# Patient Record
Sex: Female | Born: 1950 | Race: White | Hispanic: No | Marital: Married | Smoking: Never smoker
Health system: Southern US, Community
[De-identification: ages and names within clinical notes are randomized; demographics above are authoritative.]

## PROBLEM LIST (undated history)

## (undated) HISTORY — PX: HYSTERECTOMY ABDOMINAL WITH SALPINGECTOMY: SHX6725

## (undated) HISTORY — PX: OTHER SURGICAL HISTORY: SHX169

---

## 2013-06-11 ENCOUNTER — Emergency Department (HOSPITAL_COMMUNITY)
Admission: EM | Admit: 2013-06-11 | Discharge: 2013-06-11 | Disposition: A | Discharge status: Home / Self Care | Payer: No Typology Code available for payment source | Attending: Emergency Medicine | Admitting: Emergency Medicine

## 2013-06-11 ENCOUNTER — Emergency Department (HOSPITAL_COMMUNITY): Payer: No Typology Code available for payment source

## 2013-06-11 ENCOUNTER — Encounter (HOSPITAL_COMMUNITY): Payer: Self-pay | Admitting: Emergency Medicine

## 2013-06-11 DIAGNOSIS — S6990XA Unspecified injury of unspecified wrist, hand and finger(s), initial encounter: Secondary | ICD-10-CM | POA: Insufficient documentation

## 2013-06-11 DIAGNOSIS — Y9389 Activity, other specified: Secondary | ICD-10-CM | POA: Insufficient documentation

## 2013-06-11 DIAGNOSIS — S0993XA Unspecified injury of face, initial encounter: Secondary | ICD-10-CM | POA: Insufficient documentation

## 2013-06-11 DIAGNOSIS — S8990XA Unspecified injury of unspecified lower leg, initial encounter: Secondary | ICD-10-CM | POA: Insufficient documentation

## 2013-06-11 DIAGNOSIS — Y92410 Unspecified street and highway as the place of occurrence of the external cause: Secondary | ICD-10-CM | POA: Insufficient documentation

## 2013-06-11 DIAGNOSIS — S0990XA Unspecified injury of head, initial encounter: Secondary | ICD-10-CM | POA: Insufficient documentation

## 2013-06-11 MED ORDER — IBUPROFEN 800 MG PO TABS: 800.0000 mg | ORAL_TABLET | Freq: Three times a day (TID) | ORAL | Status: DC

## 2013-06-11 MED ORDER — METHOCARBAMOL 500 MG PO TABS: 500.0000 mg | ORAL_TABLET | Freq: Two times a day (BID) | ORAL | Status: DC
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
## 2013-06-11 NOTE — ED Notes (Signed)
Pt complains of neck/head pain, R arm pain and L knee pain. Pt ambutory.

## 2013-06-11 NOTE — ED Provider Notes (Signed)
Medical screening examination/treatment/procedure(s) were performed by non-physician practitioner and as supervising physician I was immediately available for consultation/collaboration.  EKG Interpretation   None         Layla Maw Martel Galvan, DO 06/11/13 2256
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
## 2014-04-26 DIAGNOSIS — I83891 Varicose veins of right lower extremities with other complications: Secondary | ICD-10-CM

## 2014-04-26 DIAGNOSIS — Z90710 Acquired absence of both cervix and uterus: Secondary | ICD-10-CM | POA: Insufficient documentation

## 2014-04-26 HISTORY — DX: Varicose veins of right lower extremity with other complications: I83.891

## 2014-04-26 HISTORY — DX: Acquired absence of both cervix and uterus: Z90.710

## 2016-11-25 DIAGNOSIS — S42301A Unspecified fracture of shaft of humerus, right arm, initial encounter for closed fracture: Secondary | ICD-10-CM

## 2016-11-25 HISTORY — DX: Unspecified fracture of shaft of humerus, right arm, initial encounter for closed fracture: S42.301A

## 2017-06-13 ENCOUNTER — Telehealth: Payer: Self-pay

## 2017-06-13 NOTE — Telephone Encounter (Signed)
CRM received asking for you to give her a call at your convenience. Please call patient.

## 2017-08-01 NOTE — Telephone Encounter (Signed)
I just found this message.  Should have been routed to TongaVanessa, Olegario MessierKathy or TiraJason.  Can someone verify follow up?

## 2018-05-14 ENCOUNTER — Telehealth: Payer: Self-pay

## 2018-05-15 NOTE — Telephone Encounter (Signed)
error 

## 2018-07-27 ENCOUNTER — Encounter: Payer: Self-pay | Admitting: Emergency Medicine

## 2019-08-30 ENCOUNTER — Emergency Department (HOSPITAL_COMMUNITY)
Admission: EM | Admit: 2019-08-30 | Discharge: 2019-08-30 | Disposition: A | Discharge status: Home / Self Care | Payer: Medicare HMO | Attending: Emergency Medicine | Admitting: Emergency Medicine

## 2019-08-30 ENCOUNTER — Other Ambulatory Visit: Payer: Self-pay

## 2019-08-30 DIAGNOSIS — F419 Anxiety disorder, unspecified: Secondary | ICD-10-CM | POA: Diagnosis not present

## 2019-08-30 DIAGNOSIS — R4589 Other symptoms and signs involving emotional state: Secondary | ICD-10-CM | POA: Diagnosis present

## 2019-08-30 LAB — COMPREHENSIVE METABOLIC PANEL
ALT: 15 U/L (ref 0–44)
AST: 22 U/L (ref 15–41)
Albumin: 4.1 g/dL (ref 3.5–5.0)
Alkaline Phosphatase: 80 U/L (ref 38–126)
Anion gap: 10 (ref 5–15)
BUN: 10 mg/dL (ref 8–23)
CO2: 24 mmol/L (ref 22–32)
Calcium: 9.7 mg/dL (ref 8.9–10.3)
Chloride: 106 mmol/L (ref 98–111)
Creatinine, Ser: 0.79 mg/dL (ref 0.44–1.00)
GFR calc Af Amer: >60 mL/min (ref >60)
GFR calc non Af Amer: >60 mL/min (ref >60)
Glucose, Bld: 172 mg/dL — ABNORMAL HIGH (ref 70–99)
Potassium: 3.7 mmol/L (ref 3.5–5.1)
Sodium: 140 mmol/L (ref 135–145)
Total Bilirubin: 1.5 mg/dL — ABNORMAL HIGH (ref 0.3–1.2)
Total Protein: 7.2 g/dL (ref 6.5–8.1)

## 2019-08-30 LAB — CBC
HCT: 42 % (ref 36.0–46.0)
Hemoglobin: 13.2 g/dL (ref 12.0–15.0)
MCH: 27.7 pg (ref 26.0–34.0)
MCHC: 31.4 g/dL (ref 30.0–36.0)
MCV: 88.2 fL (ref 80.0–100.0)
Platelets: 205 10*3/uL (ref 150–400)
RBC: 4.76 MIL/uL (ref 3.87–5.11)
RDW: 13.7 % (ref 11.5–15.5)
WBC: 7.4 10*3/uL (ref 4.0–10.5)
nRBC: 0 % (ref 0.0–0.2)

## 2019-08-30 LAB — RAPID URINE DRUG SCREEN, HOSP PERFORMED
Amphetamines: NOT DETECTED
Barbiturates: NOT DETECTED
Benzodiazepines: NOT DETECTED
Cocaine: NOT DETECTED
Opiates: NOT DETECTED
Tetrahydrocannabinol: NOT DETECTED

## 2019-08-30 LAB — SALICYLATE LEVEL: Salicylate Lvl: <7 mg/dL — ABNORMAL LOW (ref 7.0–30.0)

## 2019-08-30 LAB — ACETAMINOPHEN LEVEL: Acetaminophen (Tylenol), Serum: <10 ug/mL — ABNORMAL LOW (ref 10–30)

## 2019-08-30 LAB — ETHANOL: Alcohol, Ethyl (B): <10 mg/dL (ref <10)
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
## 2019-08-30 NOTE — ED Notes (Signed)
Patient denies pain and is resting comfortably.  
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
## 2019-08-30 NOTE — Discharge Instructions (Signed)
Please schedule a primary care appointment.  If at any point you feel unsafe or are concerned please seek additional medical care and evaluation. I recommend that you consider seeing a counselor.  I have given you a list of some of the counselors in the area however you may choose your own.  Your blood pressure was slightly high while in the emergency room.  Please see your primary care doctor in the next 1-2 weeks to get this re-checked.   I would recommend limiting the amount of media and news that you are consuming.  Watching excessive amounts of the news, or reading large amounts of news can increase anxiety.    It is important that you are getting a good night sleep, eating healthy, and getting regular exercise.  Today I recommended a CT scan on your head which you refused.

## 2019-08-30 NOTE — ED Notes (Signed)
Pt verbalized understanding of /dc instructions, follow up care and s/s requiring return to ed. Pt has no additional questions

## 2019-09-04 DIAGNOSIS — F22 Delusional disorders: Secondary | ICD-10-CM | POA: Insufficient documentation

## 2019-09-04 HISTORY — DX: Delusional disorders: F22

## 2019-09-10 DIAGNOSIS — R9431 Abnormal electrocardiogram [ECG] [EKG]: Secondary | ICD-10-CM

## 2019-09-10 HISTORY — DX: Abnormal electrocardiogram (ECG) (EKG): R94.31

## 2019-09-14 MED ORDER — RISPERIDONE 0.5 MG PO TABS
0.50 | ORAL_TABLET | ORAL | Status: DC
Start: ? — End: 2019-09-14

## 2019-09-14 MED ORDER — ALUM & MAG HYDROXIDE-SIMETH 200-200-20 MG/5ML PO SUSP
15.00 | ORAL | Status: DC
Start: ? — End: 2019-09-14

## 2019-09-14 MED ORDER — THERA PO TABS
1.00 | ORAL_TABLET | ORAL | Status: DC
Start: 2019-09-15 — End: 2019-09-14

## 2019-09-14 MED ORDER — ASPIRIN 81 MG PO TBEC
81.00 | DELAYED_RELEASE_TABLET | ORAL | Status: DC
Start: 2019-09-15 — End: 2019-09-14

## 2019-09-14 MED ORDER — GENERIC EXTERNAL MEDICATION
Status: DC
Start: ? — End: 2019-09-14

## 2019-09-14 MED ORDER — ACETAMINOPHEN 325 MG PO TABS
650.00 | ORAL_TABLET | ORAL | Status: DC
Start: ? — End: 2019-09-14

## 2019-09-14 MED ORDER — POLYETHYLENE GLYCOL 3350 17 GM/SCOOP PO POWD
17.00 | ORAL | Status: DC
Start: ? — End: 2019-09-14

## 2019-09-14 MED ORDER — RISPERIDONE 3 MG PO TABS
3.00 | ORAL_TABLET | ORAL | Status: DC
Start: 2019-09-14 — End: 2019-09-14

## 2019-09-20 ENCOUNTER — Ambulatory Visit (HOSPITAL_COMMUNITY): Payer: Medicare HMO | Admitting: Psychiatry

## 2019-09-23 ENCOUNTER — Ambulatory Visit (INDEPENDENT_AMBULATORY_CARE_PROVIDER_SITE_OTHER): Payer: Medicare HMO | Admitting: Psychiatry

## 2019-09-23 ENCOUNTER — Encounter (HOSPITAL_COMMUNITY): Payer: Self-pay | Admitting: Psychiatry

## 2019-09-23 ENCOUNTER — Other Ambulatory Visit: Payer: Self-pay

## 2019-09-23 DIAGNOSIS — F419 Anxiety disorder, unspecified: Secondary | ICD-10-CM

## 2019-09-23 DIAGNOSIS — F22 Delusional disorders: Secondary | ICD-10-CM
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
## 2019-11-15 DIAGNOSIS — G4733 Obstructive sleep apnea (adult) (pediatric): Secondary | ICD-10-CM

## 2019-11-15 DIAGNOSIS — Z9989 Dependence on other enabling machines and devices: Secondary | ICD-10-CM

## 2019-11-15 HISTORY — DX: Dependence on other enabling machines and devices: Z99.89

## 2019-11-15 HISTORY — DX: Obstructive sleep apnea (adult) (pediatric): G47.33

## 2020-05-24 ENCOUNTER — Ambulatory Visit (INDEPENDENT_AMBULATORY_CARE_PROVIDER_SITE_OTHER): Payer: Medicare HMO | Admitting: Family Medicine

## 2020-05-24 ENCOUNTER — Encounter: Payer: Self-pay | Admitting: Family Medicine

## 2020-05-24 DIAGNOSIS — F22 Delusional disorders: Secondary | ICD-10-CM | POA: Diagnosis not present

## 2020-05-24 DIAGNOSIS — G4733 Obstructive sleep apnea (adult) (pediatric): Secondary | ICD-10-CM

## 2020-05-24 DIAGNOSIS — Z9989 Dependence on other enabling machines and devices: Secondary | ICD-10-CM | POA: Diagnosis not present

## 2020-05-24 NOTE — Assessment & Plan Note (Signed)
Denies symptoms at this time.  Does not endorse any delusional or paranoid ideations.  Encouraged to consider f/u with psychiatry.

## 2020-05-24 NOTE — Assessment & Plan Note (Signed)
Managed by neurology, stable at this time.

## 2020-05-24 NOTE — Progress Notes (Signed)
Katelyn Ross - 70 y.o. female MRN [id number]  Date of birth: 1951/07/16  Subjective Chief Complaint  Patient presents with  . Establish Care    HPI Katelyn Ross is a 69 y.o. female here today for initial visit.  She reports that she has been in pretty good health.  She does have history of OSA, seen by neuro for this.  She is not taking any long term medications at this time.  She really only comes in annually for medicare wellness exam.    Per previous records she was diagnosed with delusional disorder resulting in psychiatric admission.  She was started on risperidone and had f/u with Dr. Gilmore Laroche post hospitalization but never followed up afterwards and has discontinued risperdal.  She denies any symptoms at this time.   ROS:  A comprehensive ROS was completed and negative except as noted per HPI     Allergies  Allergen Reactions  . Penicillins Other (See Comments)    Heart felt funny      History reviewed. No pertinent past medical history.  Past Surgical History:  Procedure Laterality Date  . broken bone repai    . HYSTERECTOMY ABDOMINAL WITH SALPINGECTOMY      Social History   Socioeconomic History  . Marital status: Married    Spouse name: Not on file  . Number of children: Not on file  . Years of education: Not on file  . Highest education level: Not on file  Occupational History  . Not on file  Tobacco Use  . Smoking status: Never Smoker  . Smokeless tobacco: Never Used  Vaping Use  . Vaping Use: Never used  Substance and Sexual Activity  . Alcohol use: Not Currently  . Drug use: No  . Sexual activity: Yes    Partners: Male  Other Topics Concern  . Not on file  Social History Narrative  . Not on file   Social Determinants of Health   Financial Resource Strain:   . Difficulty of Paying Living Expenses: Not on file  Food Insecurity:   . Worried About Programme researcher, broadcasting/film/video in the Last Year: Not on file  . Ran Out of Food in the Last Year: Not on  file  Transportation Needs:   . Lack of Transportation (Medical): Not on file  . Lack of Transportation (Non-Medical): Not on file  Physical Activity:   . Days of Exercise per Week: Not on file  . Minutes of Exercise per Session: Not on file  Stress:   . Feeling of Stress : Not on file  Social Connections:   . Frequency of Communication with Friends and Family: Not on file  . Frequency of Social Gatherings with Friends and Family: Not on file  . Attends Religious Services: Not on file  . Active Member of Clubs or Organizations: Not on file  . Attends Banker Meetings: Not on file  . Marital Status: Not on file    History reviewed. No pertinent family history.  Health Maintenance  Topic Date Due  . Hepatitis C Screening  Never done  . COVID-19 Vaccine (1) 06/09/2020 (Originally 12/12/1962)  . INFLUENZA VACCINE  11/09/2020 (Originally 03/12/2020)  . MAMMOGRAM  05/24/2021 (Originally 12/11/2000)  . DEXA SCAN  05/24/2021 (Originally 12/12/2015)  . COLONOSCOPY  05/24/2021 (Originally 12/11/2000)  . TETANUS/TDAP  05/24/2021 (Originally 12/11/1969)  . PNA vac Low Risk Adult (1 of 2 - PCV13) 05/24/2021 (Originally 12/12/2015)     ----------------------------------------------------------------------------------------------------------------------------------------------------------------------------------------------------------------- Physical Exam BP 132/82 (BP Location:  Left Arm, Patient Position: Sitting, Cuff Size: Normal)   Pulse (!) 118   Temp 99.6 F (37.6 C) (Oral)   Ht 5\' 7"  (1.702 m)   Wt 137 lb 1.9 oz (62.2 kg)   BMI 21.48 kg/m   Physical Exam Constitutional:      Appearance: Normal appearance.  HENT:     Head: Normocephalic and atraumatic.  Eyes:     General: No scleral icterus. Cardiovascular:     Rate and Rhythm: Normal rate and regular rhythm.  Pulmonary:     Effort: Pulmonary effort is normal.     Breath sounds: Normal breath sounds.  Musculoskeletal:      Cervical back: Neck supple.  Neurological:     General: No focal deficit present.     Mental Status: She is alert.  Psychiatric:        Mood and Affect: Mood normal.        Behavior: Behavior normal.     ------------------------------------------------------------------------------------------------------------------------------------------------------------------------------------------------------------------- Assessment and Plan  Delusional disorder (HCC) Denies symptoms at this time.  Does not endorse any delusional or paranoid ideations.  Encouraged to consider f/u with psychiatry.    OSA on CPAP Managed by neurology, stable at this time.    No orders of the defined types were placed in this encounter.   Return in about 6 months (around 11/22/2020) for annual wellness.    This visit occurred during the SARS-CoV-2 public health emergency.  Safety protocols were in place, including screening questions prior to the visit, additional usage of staff PPE, and extensive cleaning of exam room while observing appropriate contact time as indicated for disinfecting solutions.

## 2020-05-24 NOTE — Patient Instructions (Signed)
Nice to see you today! I would encourage you to get COVID vaccine.  Let's plan to follow up in April of next year to annual wellness visit.

## 2020-06-07 ENCOUNTER — Telehealth: Payer: Self-pay

## 2020-06-07 NOTE — Telephone Encounter (Signed)
Pt left voicemail with just her name and phone number.   Attempted to contact her @ 301pm on 06/07/20. No answer.

## 2020-11-22 ENCOUNTER — Ambulatory Visit: Payer: Medicare HMO | Admitting: Family Medicine

## 2020-12-01 ENCOUNTER — Encounter: Payer: Self-pay | Admitting: Family Medicine

## 2020-12-01 ENCOUNTER — Other Ambulatory Visit: Payer: Self-pay

## 2020-12-01 ENCOUNTER — Ambulatory Visit (INDEPENDENT_AMBULATORY_CARE_PROVIDER_SITE_OTHER): Payer: Medicare HMO | Admitting: Family Medicine

## 2020-12-01 VITALS — BP 137/77 | HR 99 | Temp 97.5°F | Ht 64.0 in | Wt 135.0 lb

## 2020-12-01 DIAGNOSIS — Z0000 Encounter for general adult medical examination without abnormal findings: Secondary | ICD-10-CM | POA: Diagnosis not present

## 2020-12-01 DIAGNOSIS — M2042 Other hammer toe(s) (acquired), left foot: Secondary | ICD-10-CM | POA: Diagnosis not present

## 2020-12-01 DIAGNOSIS — Z13220 Encounter for screening for lipoid disorders: Secondary | ICD-10-CM

## 2020-12-01 DIAGNOSIS — M2041 Other hammer toe(s) (acquired), right foot: Secondary | ICD-10-CM

## 2020-12-01 NOTE — Patient Instructions (Signed)
  Katelyn Ross , Thank you for taking time to come for your Medicare Wellness Visit. I appreciate your ongoing commitment to your health goals. Please review the following plan we discussed and let me know if I can assist you in the future.   These are the goals we discussed: Goals   None     This is a list of the screening recommended for you and due dates:  Health Maintenance  Topic Date Due  .  Hepatitis C: One time screening is recommended by Center for Disease Control  (CDC) for  adults born from [birth year] through 1965.   Never done  . Mammogram  05/24/2021*  . DEXA scan (bone density measurement)  05/24/2021*  . Colon Cancer Screening  05/24/2021*  . Tetanus Vaccine  05/24/2021*  . Pneumonia vaccines (1 of 2 - PCV13) 05/24/2021*  . COVID-19 Vaccine (1) 12/17/2021*  . Flu Shot  03/12/2021  . HPV Vaccine  Aged Out  *Topic was postponed. The date shown is not the original due date.

## 2020-12-01 NOTE — Progress Notes (Signed)
Medicare Annual Wellness Visit  12/01/2020  Subjective:  Katelyn Ross is a 70 y.o. female primary care patient of Katelyn Coombe, DO who had a Medicare Annual Wellness Visit today via telephone. Katelyn Ross is Retired and lives with their spouse.  Katelyn Ross reports that Katelyn Ross is socially active and does interact with friends/family regularly. Katelyn Ross is moderately physically active and enjoys gardening, reading, shopping.  Patient Care Team: Katelyn Coombe, DO as PCP - General (Family Medicine)  Advanced Directives 08/30/2019  Does Patient Have a Medical Advance Directive? No  Would patient like information on creating a medical advance directive? No - Patient declined    Hospital Utilization Over the Past 12 Months: # of hospitalizations or ER visits: 0 # of surgeries: 0  Review of Systems    Patient reports that her overall health is unchanged compared to last year.  Negative except a  All other systems negative.  Pain Assessment       Current Medications & Allergies (verified) Allergies as of 12/01/2020      Reactions   Penicillins Other (See Comments)   Heart felt funny      Medication List    as of December 01, 2020 10:30 AM   You have not been prescribed any medications.     History (reviewed): No past medical history on file. Past Surgical History:  Procedure Laterality Date  . broken bone repai    . HYSTERECTOMY ABDOMINAL WITH SALPINGECTOMY     No family history on file. Social History   Socioeconomic History  . Marital status: Married    Spouse name: Not on file  . Number of children: Not on file  . Years of education: Not on file  . Highest education level: Not on file  Occupational History  . Not on file  Tobacco Use  . Smoking status: Never Smoker  . Smokeless tobacco: Never Used  Vaping Use  . Vaping Use: Never used  Substance and Sexual Activity  . Alcohol use: Not Currently  . Drug use: No  . Sexual activity: Yes    Partners: Male  Other Topics  Concern  . Not on file  Social History Narrative  . Not on file   Social Determinants of Health   Financial Resource Strain: Not on file  Food Insecurity: Not on file  Transportation Needs: Not on file  Physical Activity: Not on file  Stress: Not on file  Social Connections: Not on file    Activities of Daily Living No flowsheet data found.  Patient Education/Literacy:    Depression Screen PHQ 2/9 Scores 05/24/2020  PHQ - 2 Score 0  PHQ- 9 Score 0     Fall Risk Fall Risk  12/03/2020 12/01/2020  Falls in the past year? 0 0  Number falls in past yr: 0 0  Injury with Fall? 0 0  Risk for fall due to : - History of fall(s)  Follow up Falls evaluation completed;Falls prevention discussed Falls evaluation completed      Exercise   Walking frequently  Diet Patient reports consuming 3 meals a day and 1 snack(s) a day Patient reports that her primary diet is: Regular Patient reports that Katelyn Ross does have regular access to food.   Depression Screen PHQ 2/9 Scores 05/24/2020  PHQ - 2 Score 0  PHQ- 9 Score 0     Fall Risk No flowsheet data found.   Objective:    Katelyn Ross was alert and able to participate appropriately during our visit.  Today's Vitals   12/01/20 1028  BP: 137/77  Pulse: 99  Temp: (!) 97.5 F (36.4 C)  SpO2: 99%  Weight: 135 lb (61.2 kg)  Height: 5\' 4"  (1.626 m)   Body mass index is 23.17 kg/m.  Advanced Directives 08/30/2019  Does Patient Have a Medical Advance Directive? No  Would patient like information on creating a medical advance directive? No - Patient declined    Hearing/Vision  . Katelyn Ross did not seem to have difficulty with hearing/understanding during the face to face visit . Reports that Katelyn Ross has had a formal eye exam by an eye care professional within the past year . Reports that Katelyn Ross has not had a formal hearing evaluation within the past year  Cognitive Function:  Mini-Cog - 12/03/20 2235    Normal clock drawing test? yes     How many words correct? 2           Normal Cognitive Function Screening: Yes  No flowsheet data found.   Immunization & Health Maintenance Record  There is no immunization history on file for this patient.  Health Maintenance  Topic Date Due  . Hepatitis C Screening  Never done  . COVID-19 Vaccine (1) Never done  . MAMMOGRAM  05/24/2021 (Originally 12/11/2000)  . DEXA SCAN  05/24/2021 (Originally 12/12/2015)  . COLONOSCOPY (Pts 45-52yrs Insurance coverage will need to be confirmed)  05/24/2021 (Originally 12/12/1995)  . TETANUS/TDAP  05/24/2021 (Originally 12/11/1969)  . PNA vac Low Risk Adult (1 of 2 - PCV13) 05/24/2021 (Originally 12/12/2015)  . INFLUENZA VACCINE  03/12/2021  . HPV VACCINES  Aged Out       Assessment:  This is a routine wellness examination for Katelyn Ross 05/12/2021.  Health Maintenance: Due or Overdue Health Maintenance Due  Topic Date Due  . Hepatitis C Screening  Never done  . COVID-19 Vaccine (1) Never done    Katelyn Ross does not need a referral for Community Assistance: Care Management:   no Social Work:    no Prescription Assistance:  no Nutrition/Diabetes Education:  no   Plan:  Personalized Goals Goals Addressed   None    Personalized Health Maintenance & Screening Recommendations  Pneumococcal vaccine  Screening mammography Bone densitometry screening Colorectal cancer screening  Lung Cancer Screening Recommended: no (Low Dose CT Chest recommended if Age 66-80 years, 30 pack-year currently smoking OR have quit w/in past 15 years) Hepatitis C Screening recommended: yes HIV Screening recommended: no  Advanced Directives: Written information was not prepared per patient's request.  Referrals & Orders No orders of the defined types were placed in this encounter.   Follow-up Plan . Follow-up with [place], DO as planned . Katelyn Ross declines all recommended screenings and vaccinations.  Katelyn Ross is aware of risks associated with not  receiving recommended vaccines and screenings.   . Follow up in 6 months with PCP   I have personally reviewed and noted the following in the patient's chart:   . Medical and social history . Use of alcohol, tobacco or illicit drugs  . Current medications and supplements . Functional ability and status . Nutritional status . Physical activity . Advanced directives . List of other physicians . Hospitalizations, surgeries, and ER visits in previous 12 months . Vitals . Screenings to include cognitive, depression, and falls . Referrals and appointments  In addition, I have reviewed and discussed with Katelyn Ross Katelyn Ross certain preventive protocols, quality metrics, and best practice recommendations. A written personalized care plan for preventive services as well as  general preventive health recommendations is available and can be mailed to the patient at her request.      Katelyn Ross  12/01/2020

## 2020-12-03 DIAGNOSIS — Z0000 Encounter for general adult medical examination without abnormal findings: Secondary | ICD-10-CM | POA: Insufficient documentation

## 2020-12-03 HISTORY — DX: Encounter for general adult medical examination without abnormal findings: Z00.00

## 2020-12-03 NOTE — Progress Notes (Signed)
Katelyn Ross - 70 y.o. female MRN [id number]  Date of birth: 03/04/1951  Subjective Chief Complaint  Patient presents with  . Medicare Wellness    HPI Katelyn Ross is a 70 y.o. female here today for annual exam.  She is doing well without complaints today. She is due for several health  Maintenance items including immunizations and screenings.  She is having some issues with hammer toes and would like to see podiatry.  She denies significant pain.    She does walk for exercise.  She feels like her diet is healthy.   ROS:  A comprehensive ROS was completed and negative except as noted per HPI  Allergies  Allergen Reactions  . Penicillins Other (See Comments)    Heart felt funny      History reviewed. No pertinent past medical history.  Past Surgical History:  Procedure Laterality Date  . broken bone repai    . HYSTERECTOMY ABDOMINAL WITH SALPINGECTOMY      Social History   Socioeconomic History  . Marital status: Married    Spouse name: Not on file  . Number of children: Not on file  . Years of education: Not on file  . Highest education level: Not on file  Occupational History  . Not on file  Tobacco Use  . Smoking status: Never Smoker  . Smokeless tobacco: Never Used  Vaping Use  . Vaping Use: Never used  Substance and Sexual Activity  . Alcohol use: Not Currently  . Drug use: No  . Sexual activity: Yes    Partners: Male  Other Topics Concern  . Not on file  Social History Narrative  . Not on file   Social Determinants of Health   Financial Resource Strain: Not on file  Food Insecurity: Not on file  Transportation Needs: Not on file  Physical Activity: Not on file  Stress: Not on file  Social Connections: Not on file    History reviewed. No pertinent family history.  Health Maintenance  Topic Date Due  . Hepatitis C Screening  Never done  . MAMMOGRAM  05/24/2021 (Originally 12/11/2000)  . DEXA SCAN  05/24/2021 (Originally 12/12/2015)  .  COLONOSCOPY (Pts 45-1yrs Insurance coverage will need to be confirmed)  05/24/2021 (Originally 12/12/1995)  . TETANUS/TDAP  05/24/2021 (Originally 12/11/1969)  . PNA vac Low Risk Adult (1 of 2 - PCV13) 05/24/2021 (Originally 12/12/2015)  . COVID-19 Vaccine (1) 12/17/2021 (Originally 12/12/1955)  . INFLUENZA VACCINE  03/12/2021  . HPV VACCINES  Aged Out     ----------------------------------------------------------------------------------------------------------------------------------------------------------------------------------------------------------------- Physical Exam BP 137/77 (BP Location: Left Arm, Patient Position: Sitting, Cuff Size: Normal)   Pulse 99   Temp (!) 97.5 F (36.4 C)   Ht 5\' 4"  (1.626 m)   Wt 135 lb (61.2 kg)   SpO2 99%   BMI 23.17 kg/m   Physical Exam Constitutional:      General: She is not in acute distress. HENT:     Head: Normocephalic and atraumatic.     Nose: Nose normal.  Eyes:     General: No scleral icterus.    Conjunctiva/sclera: Conjunctivae normal.  Neck:     Thyroid: No thyromegaly.  Cardiovascular:     Rate and Rhythm: Normal rate and regular rhythm.     Heart sounds: Normal heart sounds.  Pulmonary:     Effort: Pulmonary effort is normal.     Breath sounds: Normal breath sounds.  Abdominal:     General: Bowel sounds are normal. There is no distension.  Palpations: Abdomen is soft.     Tenderness: There is no abdominal tenderness. There is no guarding.  Musculoskeletal:        General: Normal range of motion.     Cervical back: Normal range of motion and neck supple.  Lymphadenopathy:     Cervical: No cervical adenopathy.  Skin:    General: Skin is warm and dry.     Findings: No rash.  Neurological:     Mental Status: She is alert and oriented to person, place, and time.     Cranial Nerves: No cranial nerve deficit.     Coordination: Coordination normal.  Psychiatric:        Behavior: Behavior normal.      ------------------------------------------------------------------------------------------------------------------------------------------------------------------------------------------------------------------- Assessment and Plan  No problem-specific Assessment & Plan notes found for this encounter.   No orders of the defined types were placed in this encounter.   No follow-ups on file.    This visit occurred during the SARS-CoV-2 public health emergency.  Safety protocols were in place, including screening questions prior to the visit, additional usage of staff PPE, and extensive cleaning of exam room while observing appropriate contact time as indicated for disinfecting solutions.

## 2020-12-03 NOTE — Assessment & Plan Note (Signed)
Well adult Orders Placed This Encounter  Procedures  . COMPLETE METABOLIC PANEL WITH GFR  . CBC with Differential  . Lipid Panel w/reflex Direct LDL  . Ambulatory referral to Podiatry    Referral Priority:   Routine    Referral Type:   Consultation    Referral Reason:   Specialty Services Required    Requested Specialty:   Podiatry    Number of Visits Requested:   1  Immunizations:  Discussed pneumovax with her, declines at this time.  Screening:  Declines mammogram, Hep C, DEXA and colon cancer screening.  She is aware of risks of not having these done.  Anticipatory guidance/Risk factor reduction:  Recommendations per AVS.

## 2020-12-05 ENCOUNTER — Telehealth: Payer: Self-pay

## 2020-12-05 NOTE — Telephone Encounter (Signed)
Pt lvm asking questions concerning labs that had been ordered by Dr. Ashley Royalty.   Advised patient she could have the labs drawn at [facility] without an appointment.   Pt states it's hard for her to have fasting labs. She eats 1st thing in the morning to avoid low blood sugars.   Advised Katelyn Ross that she could fast for 6 hours at any point during the day for the labs or she could have the labs drawn non-fasting (just advise the lab).

## 2020-12-07 LAB — LIPID PANEL W/REFLEX DIRECT LDL
Cholesterol: 200 mg/dL — ABNORMAL HIGH (ref <200)
HDL: 53 mg/dL (ref >=50)
LDL Cholesterol (Calc): 127 mg/dL (calc) — ABNORMAL HIGH
Non-HDL Cholesterol (Calc): 147 mg/dL (calc) — ABNORMAL HIGH (ref <130)
Total CHOL/HDL Ratio: 3.8 (calc) (ref <5.0)
Triglycerides: 102 mg/dL (ref <150)

## 2020-12-07 LAB — CBC WITH DIFFERENTIAL/PLATELET
Absolute Monocytes: 389 cells/uL (ref 200–950)
Basophils Absolute: 38 cells/uL (ref 0–200)
Basophils Relative: 0.7 %
Eosinophils Absolute: 70 cells/uL (ref 15–500)
Eosinophils Relative: 1.3 %
HCT: 36.6 % (ref 35.0–45.0)
Hemoglobin: 11.6 g/dL — ABNORMAL LOW (ref 11.7–15.5)
Lymphs Abs: 2009 cells/uL (ref 850–3900)
MCH: 27.1 pg (ref 27.0–33.0)
MCHC: 31.7 g/dL — ABNORMAL LOW (ref 32.0–36.0)
MCV: 85.5 fL (ref 80.0–100.0)
MPV: 10.4 fL (ref 7.5–12.5)
Monocytes Relative: 7.2 %
Neutro Abs: 2894 cells/uL (ref 1500–7800)
Neutrophils Relative %: 53.6 %
Platelets: 197 10*3/uL (ref 140–400)
RBC: 4.28 10*6/uL (ref 3.80–5.10)
RDW: 13.5 % (ref 11.0–15.0)
Total Lymphocyte: 37.2 %
WBC: 5.4 10*3/uL (ref 3.8–10.8)

## 2020-12-07 LAB — COMPLETE METABOLIC PANEL WITH GFR
AG Ratio: 1.8 (calc) (ref 1.0–2.5)
ALT: 12 U/L (ref 6–29)
AST: 20 U/L (ref 10–35)
Albumin: 4.4 g/dL (ref 3.6–5.1)
Alkaline phosphatase (APISO): 90 U/L (ref 37–153)
BUN: 16 mg/dL (ref 7–25)
CO2: 25 mmol/L (ref 20–32)
Calcium: 9.3 mg/dL (ref 8.6–10.4)
Chloride: 105 mmol/L (ref 98–110)
Creat: 0.65 mg/dL (ref 0.50–0.99)
GFR, Est African American: 105 mL/min/{1.73_m2} (ref >=60)
GFR, Est Non African American: 91 mL/min/{1.73_m2} (ref >=60)
Globulin: 2.5 g/dL (calc) (ref 1.9–3.7)
Glucose, Bld: 94 mg/dL (ref 65–99)
Potassium: 4.2 mmol/L (ref 3.5–5.3)
Sodium: 141 mmol/L (ref 135–146)
Total Bilirubin: 1.1 mg/dL (ref 0.2–1.2)
Total Protein: 6.9 g/dL (ref 6.1–8.1)

## 2020-12-14 ENCOUNTER — Ambulatory Visit: Payer: Medicare HMO | Admitting: Podiatry

## 2020-12-14 ENCOUNTER — Other Ambulatory Visit: Payer: Self-pay

## 2020-12-14 ENCOUNTER — Ambulatory Visit (INDEPENDENT_AMBULATORY_CARE_PROVIDER_SITE_OTHER): Payer: Medicare HMO

## 2020-12-14 ENCOUNTER — Other Ambulatory Visit: Payer: Self-pay | Admitting: Family Medicine

## 2020-12-14 DIAGNOSIS — M2042 Other hammer toe(s) (acquired), left foot: Secondary | ICD-10-CM

## 2020-12-14 DIAGNOSIS — M2041 Other hammer toe(s) (acquired), right foot: Secondary | ICD-10-CM | POA: Diagnosis not present

## 2020-12-14 DIAGNOSIS — D649 Anemia, unspecified: Secondary | ICD-10-CM

## 2020-12-14 NOTE — Patient Instructions (Signed)
Look at getting an extra depth or double depth shoe 

## 2020-12-19 NOTE — Progress Notes (Signed)
Subjective:   Patient ID: Katelyn Ross, female   DOB: 70 y.o.   MRN: [id number]   HPI 70 year old female presents the office today for concerns of bilateral hammertoes and she was discussed various treatment options for this.  She is not sure about surgery.  Currently not having any pain as she has changed shoes which does cause the pain to her resolved.  Also states that she previously saw a chiropractor was told she had neuropathy.  She states that she go months at a time without any discomfort but occasional get some burning pain to the toes.  She said no recent treatment otherwise.  She has no other concerns today.   Review of Systems  All other systems reviewed and are negative.  No past medical history on file.  Past Surgical History:  Procedure Laterality Date  . broken bone repai    . HYSTERECTOMY ABDOMINAL WITH SALPINGECTOMY      No current outpatient medications on file.  Allergies  Allergen Reactions  . Penicillins Other (See Comments)    Heart felt funny            Objective:  Physical Exam  General: AAO x3, NAD  Dermatological: Skin is warm, dry and supple bilateral. There are no open sores, no preulcerative lesions, no rash or signs of infection present.  Vascular: Dorsalis Pedis artery and Posterior Tibial artery pedal pulses are 2/4 bilateral with immedate capillary fill time.  There is no pain with calf compression, swelling, warmth, erythema.   Neruologic: Grossly intact via light touch bilateral.  Sensation intact with Semmes Weinstein monofilament  Musculoskeletal: Bunion deformities present with hammertoes.  On the right second toe the second toes overlapping the hallux.  Not able to elicit any area of tenderness.  No edema, erythema.  Muscular strength 5/5 in all groups tested bilateral.  Gait: Unassisted, Nonantalgic.       Assessment:   70 year old female with bunion, hammertoe deformity     Plan:  -Treatment options discussed including  all alternatives, risks, and complications -Etiology of symptoms were discussed -X-rays were obtained and reviewed with the patient.  No evidence of acute fracture or stress fracture.  Bunion, hammertoes are present. -We discussed both conservative as well as surgical treatment options.  Currently not having any pain so were also can hold off on surgery.  Discussed various conservative treatments including shoe modifications, offloading pads which were also dispensed today.  I do not think that she has neuropathy as her symptoms are so intermittent but we will monitor this.  Vivi Barrack DPM

## 2021-01-10 ENCOUNTER — Ambulatory Visit (INDEPENDENT_AMBULATORY_CARE_PROVIDER_SITE_OTHER): Payer: Medicare HMO | Admitting: Family Medicine

## 2021-01-10 ENCOUNTER — Other Ambulatory Visit: Payer: Self-pay

## 2021-01-10 ENCOUNTER — Encounter: Payer: Self-pay | Admitting: Family Medicine

## 2021-01-10 VITALS — BP 139/78 | HR 86 | Ht 64.0 in | Wt 131.0 lb

## 2021-01-10 DIAGNOSIS — E559 Vitamin D deficiency, unspecified: Secondary | ICD-10-CM

## 2021-01-10 DIAGNOSIS — M899 Disorder of bone, unspecified: Secondary | ICD-10-CM | POA: Diagnosis not present

## 2021-01-10 DIAGNOSIS — R5383 Other fatigue: Secondary | ICD-10-CM

## 2021-01-10 DIAGNOSIS — D649 Anemia, unspecified: Secondary | ICD-10-CM | POA: Diagnosis not present

## 2021-01-10 HISTORY — DX: Other fatigue: R53.83

## 2021-01-10 LAB — CBC WITH DIFFERENTIAL/PLATELET
HCT: 34.4 % — ABNORMAL LOW (ref 35.0–45.0)
MCH: 27.5 pg (ref 27.0–33.0)
Neutro Abs: 3332 cells/uL (ref 1500–7800)

## 2021-01-10 NOTE — Patient Instructions (Signed)
We'll be in touch with lab results.  Try adding Geritol liquid. This has iron and B vitamins.

## 2021-01-10 NOTE — Progress Notes (Signed)
Katelyn Ross - 70 y.o. female MRN [id number]  Date of birth: 07-29-1951  Subjective Chief Complaint  Patient presents with  . Fatigue    HPI Katelyn Ross is a 70 y.o. female here today with complaint of fatigue.  Fatigue is chronic and she wanted to check this out prior to having her updated COVID vaccine.  Her appetite has been good.  She denies weight loss, fever, chills.  Recent labs did show some mild anemia.  Follow up iron panel ordered but she hasn't had this completed yet.    ROS:  A comprehensive ROS was completed and negative except as noted per HPI  Allergies  Allergen Reactions  . Penicillins Other (See Comments)    Heart felt funny      History reviewed. No pertinent past medical history.  Past Surgical History:  Procedure Laterality Date  . broken bone repai    . HYSTERECTOMY ABDOMINAL WITH SALPINGECTOMY      Social History   Socioeconomic History  . Marital status: Married    Spouse name: Not on file  . Number of children: Not on file  . Years of education: Not on file  . Highest education level: Not on file  Occupational History  . Not on file  Tobacco Use  . Smoking status: Never Smoker  . Smokeless tobacco: Never Used  Vaping Use  . Vaping Use: Never used  Substance and Sexual Activity  . Alcohol use: Not Currently  . Drug use: No  . Sexual activity: Yes    Partners: Male  Other Topics Concern  . Not on file  Social History Narrative  . Not on file   Social Determinants of Health   Financial Resource Strain: Not on file  Food Insecurity: Not on file  Transportation Needs: Not on file  Physical Activity: Not on file  Stress: Not on file  Social Connections: Not on file    History reviewed. No pertinent family history.  Health Maintenance  Topic Date Due  . Hepatitis C Screening  Never done  . Zoster Vaccines- Shingrix (1 of 2) Never done  . MAMMOGRAM  05/24/2021 (Originally 12/11/2000)  . DEXA SCAN  05/24/2021 (Originally  12/12/2015)  . COLONOSCOPY (Pts 45-68yrs Insurance coverage will need to be confirmed)  05/24/2021 (Originally 12/12/1995)  . TETANUS/TDAP  05/24/2021 (Originally 12/11/1969)  . PNA vac Low Risk Adult (1 of 2 - PCV13) 05/24/2021 (Originally 12/12/2015)  . COVID-19 Vaccine (1) 12/17/2021 (Originally 12/12/1955)  . INFLUENZA VACCINE  03/12/2021  . HPV VACCINES  Aged Out     ----------------------------------------------------------------------------------------------------------------------------------------------------------------------------------------------------------------- Physical Exam BP 139/78 (BP Location: Left Arm, Patient Position: Sitting, Cuff Size: Normal)   Pulse 86   Ht 5\' 4"  (1.626 m)   Wt 131 lb (59.4 kg)   SpO2 96%   BMI 22.49 kg/m   Physical Exam Constitutional:      Appearance: Normal appearance.  Eyes:     General: No scleral icterus. Cardiovascular:     Rate and Rhythm: Normal rate and regular rhythm.  Pulmonary:     Effort: Pulmonary effort is normal.     Breath sounds: Normal breath sounds.  Musculoskeletal:     Cervical back: Neck supple.  Neurological:     General: No focal deficit present.     Mental Status: She is alert.  Psychiatric:        Mood and Affect: Mood normal.        Behavior: Behavior normal.     ------------------------------------------------------------------------------------------------------------------------------------------------------------------------------------------------------------------- Assessment and Plan  Fatigue Labs per orders.  Mild anemia noted on previous labs. Checking iron and b12 levels.  Vitamin d levels ordered.  Denies depressive symptoms      No orders of the defined types were placed in this encounter.   No follow-ups on file.    This visit occurred during the SARS-CoV-2 public health emergency.  Safety protocols were in place, including screening questions prior to the visit, additional usage  of staff PPE, and extensive cleaning of exam room while observing appropriate contact time as indicated for disinfecting solutions.

## 2021-01-10 NOTE — Assessment & Plan Note (Signed)
Labs per orders.  Mild anemia noted on previous labs. Checking iron and b12 levels.  Vitamin d levels ordered.  Denies depressive symptoms

## 2021-01-11 LAB — CBC WITH DIFFERENTIAL/PLATELET
Absolute Monocytes: 510 cells/uL (ref 200–950)
Basophils Absolute: 28 cells/uL (ref 0–200)
Basophils Relative: 0.5 %
Eosinophils Absolute: 62 cells/uL (ref 15–500)
Eosinophils Relative: 1.1 %
Hemoglobin: 11.1 g/dL — ABNORMAL LOW (ref 11.7–15.5)
Lymphs Abs: 1669 cells/uL (ref 850–3900)
MCHC: 32.3 g/dL (ref 32.0–36.0)
MCV: 85.1 fL (ref 80.0–100.0)
MPV: 10.2 fL (ref 7.5–12.5)
Monocytes Relative: 9.1 %
Neutrophils Relative %: 59.5 %
Platelets: 251 10*3/uL (ref 140–400)
RBC: 4.04 10*6/uL (ref 3.80–5.10)
RDW: 12.9 % (ref 11.0–15.0)
Total Lymphocyte: 29.8 %
WBC: 5.6 10*3/uL (ref 3.8–10.8)

## 2021-01-11 LAB — VITAMIN D 25 HYDROXY (VIT D DEFICIENCY, FRACTURES): Vit D, 25-Hydroxy: 40 ng/mL (ref 30–100)

## 2021-01-11 LAB — IRON,TIBC AND FERRITIN PANEL
%SAT: 13 % (calc) — ABNORMAL LOW (ref 16–45)
Ferritin: 126 ng/mL (ref 16–288)
Iron: 39 ug/dL — ABNORMAL LOW (ref 45–160)
TIBC: 297 mcg/dL (calc) (ref 250–450)

## 2021-01-11 LAB — VITAMIN B12: Vitamin B-12: 1387 pg/mL — ABNORMAL HIGH (ref 200–1100)

## 2021-01-29 ENCOUNTER — Ambulatory Visit (INDEPENDENT_AMBULATORY_CARE_PROVIDER_SITE_OTHER): Payer: Medicare HMO | Admitting: Family Medicine

## 2021-01-29 ENCOUNTER — Encounter: Payer: Self-pay | Admitting: Family Medicine

## 2021-01-29 ENCOUNTER — Other Ambulatory Visit: Payer: Self-pay

## 2021-01-29 VITALS — BP 115/76 | HR 110 | Temp 98.4°F | Ht 64.0 in | Wt 127.5 lb

## 2021-01-29 DIAGNOSIS — R5383 Other fatigue: Secondary | ICD-10-CM

## 2021-01-29 DIAGNOSIS — R002 Palpitations: Secondary | ICD-10-CM

## 2021-01-29 DIAGNOSIS — E611 Iron deficiency: Secondary | ICD-10-CM

## 2021-01-29 NOTE — Progress Notes (Signed)
Acute Office Visit  Subjective:    Patient ID: Katelyn Ross, female    DOB: Apr 02, 1951, 70 y.o.   MRN: [id number]  Chief Complaint  Patient presents with   Anxiety   Anemia   Allergic Reaction    Pt states she has been having palpitations since receiving the second Pfizer COVID vaccine 2 weeks ago   Palpitations    HPI Patient is in today for possible reaction to 2nd COVID vaccine.  Patient reports that she is going on a cruise next month and had to get vaccinated, though she originally did not want to. 1st Pfizer vaccine on 12/20/20, 2nd on 01/12/21. After the first vaccine, she did notice increased fatigue and upper abdominal discomfort. This resolved so she went and had her 2nd vaccine on 01/12/21. A few days later she noticed the upper abdominal shooting pains and increased fatigue again. Both of these have gradually improved and her biggest concern today is palpitations and feeling like her heart is racing. States her resting HR is usually in the 70s, but now she is staying around 107 bpm. She has not noticed any correlation with time of day or activity, reports all day long she feels like her heart is racing, which makes her tired. Denies any chest pain, shortness of breath, vision changes.    She is aware that her iron is low. Saw Dr. Ashley Royalty on 01/10/21 for fatigue. Labs showed low iron and she was instructed to buy OTC Geritol liquid as she is nervous about taking pills d/t swallowing. Never found the liquid iron - took some molasses but it made her very thirsty - said she took this in the past when she had shoulder surgery.    States she has no depression/anxiety and does not want that addressed at all.  This 2nd shot has just kicked me to the curb   History reviewed. No pertinent past medical history.  Past Surgical History:  Procedure Laterality Date   broken bone repai     HYSTERECTOMY ABDOMINAL WITH SALPINGECTOMY      History reviewed. No pertinent family  history.  Social History   Socioeconomic History   Marital status: Married    Spouse name: Not on file   Number of children: Not on file   Years of education: Not on file   Highest education level: Not on file  Occupational History   Not on file  Tobacco Use   Smoking status: Never   Smokeless tobacco: Never  Vaping Use   Vaping Use: Never used  Substance and Sexual Activity   Alcohol use: Not Currently   Drug use: No   Sexual activity: Yes    Partners: Male  Other Topics Concern   Not on file  Social History Narrative   Not on file   Social Determinants of Health   Financial Resource Strain: Not on file  Food Insecurity: Not on file  Transportation Needs: Not on file  Physical Activity: Not on file  Stress: Not on file  Social Connections: Not on file  Intimate Partner Violence: Not on file    No outpatient medications prior to visit.   No facility-administered medications prior to visit.    Allergies  Allergen Reactions   Penicillins Other (See Comments)    Heart felt funny      Review of Systems All review of systems negative except what is listed in the HPI     Objective:    Physical Exam Vitals reviewed.  Constitutional:  Appearance: Normal appearance.  Cardiovascular:     Rate and Rhythm: Regular rhythm.  Pulmonary:     Effort: Pulmonary effort is normal.     Breath sounds: Normal breath sounds.  Abdominal:     General: Bowel sounds are normal.     Palpations: Abdomen is soft.  Skin:    General: Skin is warm and dry.  Neurological:     General: No focal deficit present.     Mental Status: She is oriented to person, place, and time.  Psychiatric:        Behavior: Behavior normal.        Judgment: Judgment normal.    BP 115/76   Pulse (!) 110   Temp 98.4 F (36.9 C)   Ht 5\' 4"  (1.626 m)   Wt 127 lb 8 oz (57.8 kg)   SpO2 98%   BMI 21.89 kg/m  Wt Readings from Last 3 Encounters:  01/29/21 127 lb 8 oz (57.8 kg)  01/10/21  131 lb (59.4 kg)  12/01/20 135 lb (61.2 kg)    Health Maintenance Due  Topic Date Due   Hepatitis C Screening  Never done   Zoster Vaccines- Shingrix (1 of 2) Never done    There are no preventive care reminders to display for this patient.   No results found for: TSH Lab Results  Component Value Date   WBC 5.6 01/10/2021   HGB 11.1 (L) 01/10/2021   HCT 34.4 (L) 01/10/2021   MCV 85.1 01/10/2021   PLT 251 01/10/2021   Lab Results  Component Value Date   NA 141 12/06/2020   K 4.2 12/06/2020   CO2 25 12/06/2020   GLUCOSE 94 12/06/2020   BUN 16 12/06/2020   CREATININE 0.65 12/06/2020   BILITOT 1.1 12/06/2020   ALKPHOS 80 08/30/2019   AST 20 12/06/2020   ALT 12 12/06/2020   PROT 6.9 12/06/2020   ALBUMIN 4.1 08/30/2019   CALCIUM 9.3 12/06/2020   ANIONGAP 10 08/30/2019   Lab Results  Component Value Date   CHOL 200 (H) 12/06/2020   Lab Results  Component Value Date   HDL 53 12/06/2020   Lab Results  Component Value Date   LDLCALC 127 (H) 12/06/2020   Lab Results  Component Value Date   TRIG 102 12/06/2020   Lab Results  Component Value Date   CHOLHDL 3.8 12/06/2020   No results found for: HGBA1C     Assessment & Plan:   1. Palpitations EKG = ST 105 with occasional PVC/PAC, RBBB No previous EKG to compare. Cardiology referral placed. RBBB education sheet attached to AVS  2. Fatigue, unspecified type Slightly improved since getting vaccine. She plans to start gradually increasing activity and getting back to her swimming routine.  3. Iron deficiency Recommend she start the liquid iron as PCP instructed - can find on [employer] if not available in local pharmacy. After one month, recheck labs. Can consider infusion if inadequate response to oral iron. Education sheet attached to AVS   Patient aware of signs/symptoms requiring further/urgent evaluation.   Follow-up with PCP in 1 month to recheck labs after taking iron and discuss fatigue; or sooner if  symptoms worsen or fail to improve.     12/08/2020, NP

## 2021-01-30 NOTE — Addendum Note (Signed)
Addended by: Chalmers Cater on: 01/30/2021 11:18 AM   Modules accepted: Orders

## 2021-02-06 ENCOUNTER — Encounter: Payer: Self-pay | Admitting: Family Medicine

## 2021-02-06 ENCOUNTER — Other Ambulatory Visit: Payer: Self-pay

## 2021-02-06 ENCOUNTER — Ambulatory Visit (INDEPENDENT_AMBULATORY_CARE_PROVIDER_SITE_OTHER): Payer: Medicare HMO | Admitting: Family Medicine

## 2021-02-06 DIAGNOSIS — R5383 Other fatigue: Secondary | ICD-10-CM | POA: Diagnosis not present

## 2021-02-06 NOTE — Progress Notes (Signed)
Katelyn Ross - 70 y.o. female MRN [id number]  Date of birth: March 16, 1951  Subjective Chief Complaint  Patient presents with   Shortness of Breath   Anemia   Fatigue    HPI Katelyn Ross IS A 70 y.o. female here today for follow up of fatigue.  Previous labs with mild anemia and low iron.  She has just started on geritol with iron as she could not tolerate iron tablets.  She reports  continued fatigue which she feels may be related to receiving COVID vaccine previously.   She does feel that she get shortness of breath more easily.  She denies chest pain, wheezing, cough, or edema.  She does admit to more anxiety which may be contributing some.   ROS:  A comprehensive ROS was completed and negative except as noted per HPI  Allergies  Allergen Reactions   Penicillins Other (See Comments)    Heart felt funny      History reviewed. No pertinent past medical history.  Past Surgical History:  Procedure Laterality Date   broken bone repai     HYSTERECTOMY ABDOMINAL WITH SALPINGECTOMY      Social History   Socioeconomic History   Marital status: Married    Spouse name: Not on file   Number of children: Not on file   Years of education: Not on file   Highest education level: Not on file  Occupational History   Not on file  Tobacco Use   Smoking status: Never   Smokeless tobacco: Never  Vaping Use   Vaping Use: Never used  Substance and Sexual Activity   Alcohol use: Not Currently   Drug use: No   Sexual activity: Yes    Partners: Male  Other Topics Concern   Not on file  Social History Narrative   Not on file   Social Determinants of Health   Financial Resource Strain: Not on file  Food Insecurity: Not on file  Transportation Needs: Not on file  Physical Activity: Not on file  Stress: Not on file  Social Connections: Not on file    History reviewed. No pertinent family history.  Health Maintenance  Topic Date Due   Hepatitis C Screening  Never done    Zoster Vaccines- Shingrix (1 of 2) Never done   MAMMOGRAM  05/24/2021 (Originally 12/11/2000)   DEXA SCAN  05/24/2021 (Originally 12/12/2015)   COLONOSCOPY (Pts 45-71yrs Insurance coverage will need to be confirmed)  05/24/2021 (Originally 12/12/1995)   TETANUS/TDAP  05/24/2021 (Originally 12/11/1969)   PNA vac Low Risk Adult (1 of 2 - PCV13) 05/24/2021 (Originally 12/12/2015)   INFLUENZA VACCINE  03/12/2021   COVID-19 Vaccine (3 - Booster for Pfizer series) 06/14/2021   HPV VACCINES  Aged Out     ----------------------------------------------------------------------------------------------------------------------------------------------------------------------------------------------------------------- Physical Exam BP 127/63 (BP Location: Left Arm, Patient Position: Sitting, Cuff Size: Normal)   Pulse (!) 111   Temp 97.9 F (36.6 C)   Ht 5\' 4"  (1.626 m)   Wt 128 lb (58.1 kg)   SpO2 98%   BMI 21.97 kg/m   Physical Exam Constitutional:      Appearance: She is well-developed.  HENT:     Head: Normocephalic and atraumatic.  Eyes:     General: No scleral icterus. Cardiovascular:     Rate and Rhythm: Normal rate and regular rhythm.     Pulses: Normal pulses.     Heart sounds: Normal heart sounds.  Pulmonary:     Effort: Pulmonary effort is normal.  Breath sounds: Normal breath sounds.  Musculoskeletal:     Cervical back: Neck supple.  Neurological:     General: No focal deficit present.     Mental Status: She is alert.  Psychiatric:        Mood and Affect: Mood normal.        Behavior: Behavior normal.    ------------------------------------------------------------------------------------------------------------------------------------------------------------------------------------------------------------------- Assessment and Plan  Fatigue Discussed with her that I don't think this is related to her COVID vaccine.  She does have mild iron deficiency anemia.  Recommend  continuation of geritol with iron.  Recheck cbc and iron levels in 2-3 months. Admits to increased anxiety which may be contributing some.  Declines medication to help with this     No orders of the defined types were placed in this encounter.   Return in about 3 months (around 05/09/2021) for iron deficiency.    This visit occurred during the SARS-CoV-2 public health emergency.  Safety protocols were in place, including screening questions prior to the visit, additional usage of staff PPE, and extensive cleaning of exam room while observing appropriate contact time as indicated for disinfecting solutions.

## 2021-02-06 NOTE — Patient Instructions (Signed)
Stick with the geritol with iron supplement.   Follow up with me in 3 months.

## 2021-02-06 NOTE — Assessment & Plan Note (Addendum)
Discussed with her that I don't think this is related to her COVID vaccine.  She does have mild iron deficiency anemia.  Recommend continuation of geritol with iron.  Recheck cbc and iron levels in 2-3 months. Admits to increased anxiety which may be contributing some.  Declines medication to help with this

## 2021-02-13 ENCOUNTER — Encounter: Payer: Self-pay | Admitting: Family Medicine

## 2021-02-13 ENCOUNTER — Ambulatory Visit (INDEPENDENT_AMBULATORY_CARE_PROVIDER_SITE_OTHER): Payer: Medicare HMO | Admitting: Family Medicine

## 2021-02-13 ENCOUNTER — Other Ambulatory Visit: Payer: Self-pay

## 2021-02-13 VITALS — BP 122/71 | HR 101 | Ht 64.0 in | Wt 131.0 lb

## 2021-02-13 DIAGNOSIS — R140 Abdominal distension (gaseous): Secondary | ICD-10-CM

## 2021-02-13 HISTORY — DX: Abdominal distension (gaseous): R14.0

## 2021-02-13 NOTE — Assessment & Plan Note (Addendum)
Checking labs including CMP and CBC today [place] of abdomen ordered.

## 2021-02-13 NOTE — Patient Instructions (Signed)
We'll be in touch with lab results.  Stop downstairs and schedule the abdominal ultrasound

## 2021-02-13 NOTE — Progress Notes (Signed)
Katelyn Ross - 70 y.o. female MRN [id number]  Date of birth: 09/15/1950  Subjective No chief complaint on file.   HPI Katelyn Ross is a 70 y.o. female here today with complaint of abdominal distention.  She has noticed increased distention of her abdomen over the past 1-2 weeks.  She denies ay pain associated with this.  He appetite has been normal.  She reports having normal bowel movement twice per day.  She has had some issues with fatigue and mild iron deficieny.  She is taking an iron supplement.    ROS:  A comprehensive ROS was completed and negative except as noted per HPI  Allergies  Allergen Reactions   Penicillins Other (See Comments)    Heart felt funny      No past medical history on file.  Past Surgical History:  Procedure Laterality Date   broken bone repai     HYSTERECTOMY ABDOMINAL WITH SALPINGECTOMY      Social History   Socioeconomic History   Marital status: Married    Spouse name: Not on file   Number of children: Not on file   Years of education: Not on file   Highest education level: Not on file  Occupational History   Not on file  Tobacco Use   Smoking status: Never   Smokeless tobacco: Never  Vaping Use   Vaping Use: Never used  Substance and Sexual Activity   Alcohol use: Not Currently   Drug use: No   Sexual activity: Yes    Partners: Male  Other Topics Concern   Not on file  Social History Narrative   Not on file   Social Determinants of Health   Financial Resource Strain: Not on file  Food Insecurity: Not on file  Transportation Needs: Not on file  Physical Activity: Not on file  Stress: Not on file  Social Connections: Not on file    No family history on file.  Health Maintenance  Topic Date Due   Hepatitis C Screening  Never done   Zoster Vaccines- Shingrix (1 of 2) Never done   MAMMOGRAM  05/24/2021 (Originally 12/11/2000)   DEXA SCAN  05/24/2021 (Originally 12/12/2015)   COLONOSCOPY (Pts 45-70yrs Insurance coverage  will need to be confirmed)  05/24/2021 (Originally 12/12/1995)   TETANUS/TDAP  05/24/2021 (Originally 12/11/1969)   PNA vac Low Risk Adult (1 of 2 - PCV13) 05/24/2021 (Originally 12/12/2015)   INFLUENZA VACCINE  03/12/2021   COVID-19 Vaccine (3 - Booster for Pfizer series) 06/14/2021   HPV VACCINES  Aged Out     ----------------------------------------------------------------------------------------------------------------------------------------------------------------------------------------------------------------- Physical Exam BP 122/71   Pulse (!) 101   Ht 5\' 4"  (1.626 m)   Wt 131 lb (59.4 kg)   BMI 22.49 kg/m   Physical Exam Constitutional:      Appearance: Normal appearance.  HENT:     Head: Normocephalic.  Cardiovascular:     Rate and Rhythm: Normal rate and regular rhythm.  Pulmonary:     Effort: Pulmonary effort is normal.     Breath sounds: Normal breath sounds.  Abdominal:     Comments: Hyperactive bowel sounds.  Abdomen is distended without tenderness   Musculoskeletal:     Cervical back: Neck supple.  Neurological:     Mental Status: She is alert.    ------------------------------------------------------------------------------------------------------------------------------------------------------------------------------------------------------------------- Assessment and Plan  Abdominal distention Checking labs including CMP and CBC today of abdomen ordered.     No orders of the defined types were placed in this encounter.  No follow-ups on file.    This visit occurred during the SARS-CoV-2 public health emergency.  Safety protocols were in place, including screening questions prior to the visit, additional usage of staff PPE, and extensive cleaning of exam room while observing appropriate contact time as indicated for disinfecting solutions.

## 2021-02-14 ENCOUNTER — Other Ambulatory Visit: Payer: Self-pay | Admitting: Family Medicine

## 2021-02-14 ENCOUNTER — Ambulatory Visit (HOSPITAL_BASED_OUTPATIENT_CLINIC_OR_DEPARTMENT_OTHER)
Admission: RE | Admit: 2021-02-14 | Discharge: 2021-02-14 | Disposition: A | Discharge status: Home / Self Care | Payer: Medicare HMO | Source: Ambulatory Visit | Attending: Family Medicine | Admitting: Family Medicine

## 2021-02-14 DIAGNOSIS — R188 Other ascites: Secondary | ICD-10-CM

## 2021-02-14 DIAGNOSIS — R140 Abdominal distension (gaseous): Secondary | ICD-10-CM | POA: Insufficient documentation

## 2021-02-14 DIAGNOSIS — K7460 Unspecified cirrhosis of liver: Secondary | ICD-10-CM

## 2021-02-14 LAB — CBC WITH DIFFERENTIAL/PLATELET
Absolute Monocytes: 458 cells/uL (ref 200–950)
Basophils Absolute: 31 cells/uL (ref 0–200)
Basophils Relative: 0.5 %
Eosinophils Absolute: 43 cells/uL (ref 15–500)
Eosinophils Relative: 0.7 %
HCT: 35.4 % (ref 35.0–45.0)
Hemoglobin: 11.2 g/dL — ABNORMAL LOW (ref 11.7–15.5)
Lymphs Abs: 1586 cells/uL (ref 850–3900)
MCH: 25.9 pg — ABNORMAL LOW (ref 27.0–33.0)
MCHC: 31.6 g/dL — ABNORMAL LOW (ref 32.0–36.0)
MCV: 81.9 fL (ref 80.0–100.0)
MPV: 9.7 fL (ref 7.5–12.5)
Monocytes Relative: 7.5 %
Neutro Abs: 3983 cells/uL (ref 1500–7800)
Neutrophils Relative %: 65.3 %
Platelets: 318 10*3/uL (ref 140–400)
RBC: 4.32 10*6/uL (ref 3.80–5.10)
RDW: 14.1 % (ref 11.0–15.0)
Total Lymphocyte: 26 %
WBC: 6.1 10*3/uL (ref 3.8–10.8)

## 2021-02-14 LAB — COMPLETE METABOLIC PANEL WITH GFR
AG Ratio: 1.2 (calc) (ref 1.0–2.5)
ALT: 11 U/L (ref 6–29)
AST: 21 U/L (ref 10–35)
Albumin: 3.1 g/dL — ABNORMAL LOW (ref 3.6–5.1)
Alkaline phosphatase (APISO): 106 U/L (ref 37–153)
BUN: 18 mg/dL (ref 7–25)
CO2: 22 mmol/L (ref 20–32)
Calcium: 8.6 mg/dL (ref 8.6–10.4)
Chloride: 102 mmol/L (ref 98–110)
Creat: 0.63 mg/dL (ref 0.60–0.93)
GFR, Est African American: 105 mL/min/{1.73_m2} (ref >=60)
GFR, Est Non African American: 91 mL/min/{1.73_m2} (ref >=60)
Globulin: 2.6 g/dL (calc) (ref 1.9–3.7)
Glucose, Bld: 97 mg/dL (ref 65–99)
Potassium: 3.8 mmol/L (ref 3.5–5.3)
Sodium: 138 mmol/L (ref 135–146)
Total Bilirubin: 0.9 mg/dL (ref 0.2–1.2)
Total Protein: 5.7 g/dL — ABNORMAL LOW (ref 6.1–8.1)

## 2021-02-16 LAB — PROTIME-INR
INR: 1
Prothrombin Time: 10.3 s (ref 9.0–11.5)

## 2021-02-16 LAB — HEPATITIS B SURFACE ANTIBODY,QUALITATIVE: Hep B S Ab: NONREACTIVE

## 2021-02-16 LAB — HEPATIC FUNCTION PANEL
AG Ratio: 1.3 (calc) (ref 1.0–2.5)
ALT: 15 U/L (ref 6–29)
AST: 30 U/L (ref 10–35)
Albumin: 3.2 g/dL — ABNORMAL LOW (ref 3.6–5.1)
Alkaline phosphatase (APISO): 105 U/L (ref 37–153)
Bilirubin, Direct: 0.3 mg/dL — ABNORMAL HIGH (ref 0.0–0.2)
Globulin: 2.5 g/dL (calc) (ref 1.9–3.7)
Indirect Bilirubin: 0.7 mg/dL (calc) (ref 0.2–1.2)
Total Bilirubin: 1 mg/dL (ref 0.2–1.2)
Total Protein: 5.7 g/dL — ABNORMAL LOW (ref 6.1–8.1)

## 2021-02-16 LAB — HEPATITIS C ANTIBODY
Hepatitis C Ab: NONREACTIVE
SIGNAL TO CUT-OFF: 0.01 (ref <1.00)

## 2021-02-16 LAB — HEPATITIS B CORE ANTIBODY, TOTAL: Hep B Core Total Ab: NONREACTIVE

## 2021-02-16 LAB — HEPATITIS B SURFACE ANTIGEN: Hepatitis B Surface Ag: NONREACTIVE

## 2021-02-16 LAB — HEPATITIS A ANTIBODY, TOTAL: Hepatitis A AB,Total: NONREACTIVE

## 2021-02-27 ENCOUNTER — Ambulatory Visit (INDEPENDENT_AMBULATORY_CARE_PROVIDER_SITE_OTHER): Payer: Medicare HMO | Admitting: Medical-Surgical

## 2021-02-27 ENCOUNTER — Encounter: Payer: Self-pay | Admitting: Medical-Surgical

## 2021-02-27 ENCOUNTER — Other Ambulatory Visit: Payer: Self-pay

## 2021-02-27 VITALS — BP 116/78 | HR 108 | Temp 98.0°F | Ht 64.0 in | Wt 141.8 lb

## 2021-02-27 DIAGNOSIS — R188 Other ascites: Secondary | ICD-10-CM | POA: Diagnosis not present

## 2021-02-27 DIAGNOSIS — R600 Localized edema: Secondary | ICD-10-CM

## 2021-02-27 DIAGNOSIS — K7460 Unspecified cirrhosis of liver: Secondary | ICD-10-CM | POA: Diagnosis not present

## 2021-02-27 NOTE — Progress Notes (Signed)
  HPI with pertinent ROS:   CC: Abdominal distention, lower extremity swelling  HPI: Pleasant 70 year old female presenting today for worsening abdominal distention and new onset lower extremity edema.  She was seen by Dr. Ashley Royalty on 7/6 where she had an ultrasound completed showing ascites.  She then went on a cruise that had been planned for quite a while and notes that her abdominal distention began to get much worse.  While she was on the cruise, she noted some bilateral lower extremity swelling that involves the feet, ankles, and most of the lower legs.  She has been using lemon to help with the fluid issue and feels that it has helped some.  She is feeling very miserable at this point and just wants some relief.  I reviewed the past medical history, family history, social history, surgical history, and allergies today and no changes were needed.  Please see the problem list section below in epic for further details.   Physical exam:   General: Well Developed, well nourished, and in no acute distress.  Neuro: Alert and oriented x3.  HEENT: Normocephalic, atraumatic.  Skin: Warm and dry. Cardiac: Regular rate and rhythm, no murmurs rubs or gallops, 4+ bilateral lower extremity edema lessening to 2+ edema mid shin bilaterally.  Respiratory: Clear to auscultation bilaterally. Not using accessory muscles, speaking in full sentences. Abdomen: Firm, tenderness to the bilateral lower quadrants, distended. Bowel sounds + x 4 quadrants.   Impression and Recommendations:    1. Cirrhosis of liver with ascites, unspecified hepatic cirrhosis type (HCC) IR referral for paracentesis has already been placed.  Since she was on a cruise and had no signal, we will attempt to reach out to have her scheduled for that.  Referring to GI for further investigation and management. - Ambulatory referral to Gastroenterology  2. Bilateral lower extremity edema Suspect there is a component of venous insufficiency  as well as compression of blood vessels related to ascites.  Recommend compression stockings but patient reports she is unable to tolerate them.  2 large Ace wraps provided with instructions to wrap with moderate compression from the toes upward to the knees.  As swelling decreases, rewrap legs accordingly.  Avoid dietary sodium and elevate legs whenever possible.  Return if symptoms worsen or fail to improve. ___________________________________________ Thayer Ohm, DNP, APRN, FNP-BC Primary Care and Sports Medicine [facility]

## 2021-02-28 ENCOUNTER — Telehealth: Payer: Self-pay

## 2021-02-28 ENCOUNTER — Ambulatory Visit: Payer: Medicare HMO | Admitting: Family Medicine

## 2021-02-28 NOTE — Telephone Encounter (Signed)
Contacted Scheduling for IR appt for Paracentesis.  Pt scheduled for 03/01/21 @ 1 pm at [facility] Radiology on the 1st floor.   Called and advised patient of appointment, location, and check-in location at main hospital.

## 2021-03-01 ENCOUNTER — Other Ambulatory Visit: Payer: Self-pay | Admitting: Radiology

## 2021-03-01 ENCOUNTER — Other Ambulatory Visit: Payer: Self-pay

## 2021-03-01 ENCOUNTER — Ambulatory Visit (HOSPITAL_COMMUNITY)
Admission: RE | Admit: 2021-03-01 | Discharge: 2021-03-01 | Disposition: A | Discharge status: Home / Self Care | Payer: Medicare HMO | Source: Ambulatory Visit | Attending: Family Medicine | Admitting: Family Medicine

## 2021-03-01 DIAGNOSIS — R188 Other ascites: Secondary | ICD-10-CM | POA: Diagnosis present

## 2021-03-01 HISTORY — PX: IR PARACENTESIS: IMG2679

## 2021-03-01 LAB — ALBUMIN, PLEURAL OR PERITONEAL FLUID: Albumin, Fluid: 1.8 g/dL

## 2021-03-01 LAB — BODY FLUID CELL COUNT WITH DIFFERENTIAL
Eos, Fluid: 0 %
Lymphs, Fluid: 37 %
Monocyte-Macrophage-Serous Fluid: 62 % (ref 50–90)
Neutrophil Count, Fluid: 1 % (ref 0–25)
Total Nucleated Cell Count, Fluid: 122 cu mm (ref 0–1000)

## 2021-03-01 LAB — GLUCOSE, PLEURAL OR PERITONEAL FLUID: Glucose, Fluid: 94 mg/dL

## 2021-03-01 LAB — PROTEIN, PLEURAL OR PERITONEAL FLUID: Total protein, fluid: 3.2 g/dL

## 2021-03-01 LAB — GRAM STAIN

## 2021-03-01 LAB — AMYLASE, PLEURAL OR PERITONEAL FLUID: Amylase, Fluid: 26 U/L

## 2021-03-01 LAB — LACTATE DEHYDROGENASE, PLEURAL OR PERITONEAL FLUID: LD, Fluid: 85 U/L — ABNORMAL HIGH (ref 3–23)

## 2021-03-01 MED ORDER — LIDOCAINE HCL 1 % IJ SOLN
INTRAMUSCULAR | Status: AC
  Filled 2021-03-01: qty 20

## 2021-03-01 MED ORDER — LIDOCAINE HCL (PF) 1 % IJ SOLN
INTRAMUSCULAR | Status: DC | PRN
  Administered 2021-03-01: 10 mL

## 2021-03-01 NOTE — Procedures (Signed)
PROCEDURE SUMMARY:  Successful US guided paracentesis from [facility].  Yielded 7.6 L of clear yellow fluid.  No immediate complications.  Pt tolerated well.   Specimen was sent for labs.  EBL < 66mL  Brayton El PA-C 03/01/2021 1:52 PM

## 2021-03-02 ENCOUNTER — Ambulatory Visit: Payer: Medicare HMO | Admitting: Family Medicine

## 2021-03-02 LAB — TRIGLYCERIDES, BODY FLUIDS: Triglycerides, Fluid: 32 mg/dL

## 2021-03-02 LAB — CYTOLOGY - NON PAP

## 2021-03-04 LAB — LIPASE, FLUID: Lipase-Fluid: 53 U/L

## 2021-03-06 LAB — CULTURE, BODY FLUID W GRAM STAIN -BOTTLE: Culture: NO GROWTH

## 2021-03-07 ENCOUNTER — Telehealth: Payer: Self-pay

## 2021-03-07 ENCOUNTER — Ambulatory Visit: Payer: Medicare HMO | Admitting: Cardiology

## 2021-03-07 NOTE — Telephone Encounter (Signed)
Pt aware of Dr. Ashley Royalty request that she come in for an OV to discuss the results. Pt agreeable to scheduling an appt so I transferred the call to the front desk for scheduling.

## 2021-03-07 NOTE — Telephone Encounter (Signed)
LVM for pt to call to discuss.  T. Nur Krasinski, CMA  

## 2021-03-07 NOTE — Telephone Encounter (Signed)
-----   Message from Everrett Coombe, DO sent at 03/07/2021  7:39 AM EDT ----- Let's have Ms [place] follow up with me to review labs and further management to prevent fluid from building up.  Thanks!  CM

## 2021-03-08 ENCOUNTER — Telehealth: Payer: Self-pay | Admitting: Family Medicine

## 2021-03-08 NOTE — Telephone Encounter (Signed)
Patient already has an appointment coming up on 03/12/21, next Monday. No further questions.

## 2021-03-08 NOTE — Telephone Encounter (Signed)
-----   Message from [place], [place] sent at 03/08/2021  4:11 PM EDT ----- Please call patient and schedule follow-up appt concerning fluid build-up. See previous note.   Thanks

## 2021-03-09 LAB — BASIC METABOLIC PANEL
BUN: 24 — AB (ref 4–21)
CO2: 22 (ref 13–22)
Chloride: 101 (ref 99–108)
Creatinine: 0.7 (ref 0.5–1.1)
Glucose: 108
Potassium: 4.3 (ref 3.4–5.3)
Sodium: 136 — AB (ref 137–147)

## 2021-03-09 LAB — HEPATIC FUNCTION PANEL
ALT: 19 (ref 7–35)
AST: 24 (ref 13–35)
Alkaline Phosphatase: 98 (ref 25–125)

## 2021-03-09 LAB — COMPREHENSIVE METABOLIC PANEL
Calcium: 98 — AB (ref 8.7–10.7)
GFR calc Af Amer: 99
GFR calc non Af Amer: 81
Globulin: 2.7

## 2021-03-12 ENCOUNTER — Encounter: Payer: Self-pay | Admitting: Family Medicine

## 2021-03-12 ENCOUNTER — Other Ambulatory Visit: Payer: Self-pay

## 2021-03-12 ENCOUNTER — Ambulatory Visit (INDEPENDENT_AMBULATORY_CARE_PROVIDER_SITE_OTHER): Payer: Medicare HMO | Admitting: Family Medicine

## 2021-03-12 VITALS — BP 122/72 | HR 116 | Temp 98.4°F | Wt 124.0 lb

## 2021-03-12 DIAGNOSIS — R188 Other ascites: Secondary | ICD-10-CM

## 2021-03-12 DIAGNOSIS — K7460 Unspecified cirrhosis of liver: Secondary | ICD-10-CM

## 2021-03-12 HISTORY — DX: Unspecified cirrhosis of liver: K74.60

## 2021-03-12 HISTORY — DX: Other ascites: R18.8

## 2021-03-12 NOTE — Progress Notes (Signed)
Katelyn Ross - 70 y.o. female MRN [id number]  Date of birth: 1951-01-08  Subjective Chief Complaint  Patient presents with   Follow-up    HPI Katelyn Ross is a 70 year old female here today for follow-up visit.  She has had increased ascites over the past couple months.  She did undergo diagnostic paracentesis recently with approximately 7.5 mL of fluid removed.  Previous ultrasound with findings consistent with hepatic cirrhosis.  Fluid removed during paracentesis was fairly unremarkable and consistent cirrhosis as source.  She was referred to GI and was seen recently at [facility].  She was started on spironolactone and furosemide.  CT scan of the abdomen was ordered as well however she was given expired contrast.  She has contacted them however has not heard back regarding this.  She reports that she did have recent labs completed at [facility].  She did have some concerns about the collection procedure for her labs there and does not feel comfortable returning for continued care at this facility due to given the expired contrast as well as collection issues.  She does report that she continues to feel fatigue.  She has started to have more abdominal distention again.  ROS:  A comprehensive ROS was completed and negative except as noted per HPI     Allergies  Allergen Reactions   Penicillins Other (See Comments)    Heart felt funny      Past Medical History:  Diagnosis Date   Abdominal distention 02/13/2021   Delusional disorder (HCC) 09/04/2019   Fatigue 01/10/2021   Fracture of right humerus 11/25/2016   OSA on CPAP 11/15/2019   Prolonged Q-T interval on ECG 09/10/2019   S/P hysterectomy 04/26/2014   Varicose veins of right leg with edema 04/26/2014   Well adult exam 12/03/2020    Past Surgical History:  Procedure Laterality Date   broken bone repai     HYSTERECTOMY ABDOMINAL WITH SALPINGECTOMY     IR PARACENTESIS  03/01/2021    Social History   Socioeconomic History    Marital status: Married    Spouse name: Not on file   Number of children: Not on file   Years of education: Not on file   Highest education level: Not on file  Occupational History   Not on file  Tobacco Use   Smoking status: Never   Smokeless tobacco: Never  Vaping Use   Vaping Use: Never used  Substance and Sexual Activity   Alcohol use: Not Currently   Drug use: No   Sexual activity: Yes    Partners: Male  Other Topics Concern   Not on file  Social History Narrative   Not on file   Social Determinants of Health   Financial Resource Strain: Not on file  Food Insecurity: Not on file  Transportation Needs: Not on file  Physical Activity: Not on file  Stress: Not on file  Social Connections: Not on file    No family history on file.  Health Maintenance  Topic Date Due   INFLUENZA VACCINE  03/12/2021   MAMMOGRAM  05/24/2021 (Originally 12/11/2000)   DEXA SCAN  05/24/2021 (Originally 12/12/2015)   COLONOSCOPY (Pts 45-62yrs Insurance coverage will need to be confirmed)  05/24/2021 (Originally 12/12/1995)   TETANUS/TDAP  05/24/2021 (Originally 12/11/1969)   PNA vac Low Risk Adult (1 of 2 - PCV13) 05/24/2021 (Originally 12/12/2015)   Zoster Vaccines- Shingrix (1 of 2) 05/30/2021 (Originally 12/11/2000)   COVID-19 Vaccine (3 - Booster for Pfizer series) 06/14/2021  Hepatitis C Screening  Completed   HPV VACCINES  Aged Out     ----------------------------------------------------------------------------------------------------------------------------------------------------------------------------------------------------------------- Physical Exam BP 122/72 (BP Location: Left Arm, Patient Position: Sitting, Cuff Size: Small)   Pulse (!) 116   Temp 98.4 F (36.9 C) (Oral)   Wt 124 lb 0.6 oz (56.3 kg)   SpO2 99%   BMI 21.29 kg/m   Physical Exam Constitutional:      Appearance: Normal appearance.  HENT:     Head: Normocephalic and atraumatic.  Eyes:     General: No scleral  icterus. Cardiovascular:     Rate and Rhythm: Normal rate and regular rhythm.  Pulmonary:     Effort: Pulmonary effort is normal.     Breath sounds: Normal breath sounds.  Abdominal:     General: There is distension.     Palpations: Abdomen is soft.     Tenderness: There is no abdominal tenderness.  Musculoskeletal:     Cervical back: Neck supple.  Neurological:     General: No focal deficit present.     Mental Status: She is alert.  Psychiatric:        Mood and Affect: Mood normal.        Behavior: Behavior normal.    ------------------------------------------------------------------------------------------------------------------------------------------------------------------------------------------------------------------- Assessment and Plan  Cirrhosis of liver with ascites (HCC) Hepatic cirrhosis noted on previous ultrasound.  She has seen GI at [facility] however would prefer to stay in the [facility] system and was concerned about her experience there.  New referral placed to lower GI.  Recommend that she continue on spironolactone as well as furosemide.  Low-sodium diet and fluid restriction recommended.   No orders of the defined types were placed in this encounter.   No follow-ups on file.    This visit occurred during the SARS-CoV-2 public health emergency.  Safety protocols were in place, including screening questions prior to the visit, additional usage of staff PPE, and extensive cleaning of exam room while observing appropriate contact time as indicated for disinfecting solutions.

## 2021-03-12 NOTE — Patient Instructions (Signed)
Continue furosemide and spironolactone I have placed a referral to [facility] in [place] Have [place] send over copy of abdominal CT scan.

## 2021-03-12 NOTE — Assessment & Plan Note (Signed)
Hepatic cirrhosis noted on previous ultrasound.  She has seen GI at [facility] however would prefer to stay in the [facility] system and was concerned about her experience there.  New referral placed to lower GI.  Recommend that she continue on spironolactone as well as furosemide.  Low-sodium diet and fluid restriction recommended.

## 2021-03-15 ENCOUNTER — Other Ambulatory Visit: Payer: Self-pay | Admitting: Family Medicine

## 2021-03-15 ENCOUNTER — Telehealth: Payer: Self-pay

## 2021-03-15 DIAGNOSIS — R188 Other ascites: Secondary | ICD-10-CM

## 2021-03-15 DIAGNOSIS — R140 Abdominal distension (gaseous): Secondary | ICD-10-CM

## 2021-03-15 NOTE — Telephone Encounter (Signed)
Patient called stating feeling better with taking Lasix only. Has urinated 3 times so far by 4pm today.  Spoke to [place] concerning referral. Advised patient Katelyn Ross is with [facility]. Pt needs them to schedule her appt.

## 2021-03-21 ENCOUNTER — Encounter: Payer: Self-pay | Admitting: Family Medicine

## 2021-03-23 ENCOUNTER — Telehealth: Payer: Self-pay

## 2021-03-23 ENCOUNTER — Encounter: Payer: Self-pay | Admitting: Family Medicine

## 2021-03-23 NOTE — Telephone Encounter (Signed)
Faxed a request for GI office visit notes and labs from 03/09/21 visit.   Records to be faxed to  [facility] @ [phone] for physician review. Then [place] should call pt to schedule appt.

## 2021-03-26 ENCOUNTER — Ambulatory Visit (INDEPENDENT_AMBULATORY_CARE_PROVIDER_SITE_OTHER): Payer: Medicare HMO

## 2021-03-26 ENCOUNTER — Other Ambulatory Visit: Payer: Self-pay

## 2021-03-26 DIAGNOSIS — R5383 Other fatigue: Secondary | ICD-10-CM | POA: Diagnosis not present

## 2021-03-26 DIAGNOSIS — R188 Other ascites: Secondary | ICD-10-CM

## 2021-03-26 DIAGNOSIS — N838 Other noninflammatory disorders of ovary, fallopian tube and broad ligament: Secondary | ICD-10-CM

## 2021-03-26 DIAGNOSIS — R140 Abdominal distension (gaseous): Secondary | ICD-10-CM | POA: Diagnosis not present

## 2021-03-26 MED ORDER — IOHEXOL 300 MG/ML  SOLN
100.0000 mL | Freq: Once | INTRAMUSCULAR | Status: AC | PRN
  Administered 2021-03-26: 100 mL via INTRAVENOUS

## 2021-03-27 ENCOUNTER — Other Ambulatory Visit: Payer: Self-pay | Admitting: Osteopathic Medicine

## 2021-03-27 DIAGNOSIS — N838 Other noninflammatory disorders of ovary, fallopian tube and broad ligament: Secondary | ICD-10-CM

## 2021-03-27 HISTORY — DX: Other noninflammatory disorders of ovary, fallopian tube and broad ligament: N83.8

## 2021-03-27 NOTE — Progress Notes (Addendum)
Discussed CT results w/ patient Answered all questions Advised Liver/GB likely not cause of her pain based on CT  Referral for [place] and GYN Onc

## 2021-03-27 NOTE — Addendum Note (Signed)
Addended by: Deirdre Pippins on: 03/27/2021 02:38 PM   Modules accepted: Orders

## 2021-03-27 NOTE — Progress Notes (Unsigned)
Correction refer gyn onc

## 2021-03-28 DIAGNOSIS — E871 Hypo-osmolality and hyponatremia: Secondary | ICD-10-CM

## 2021-03-28 DIAGNOSIS — R197 Diarrhea, unspecified: Secondary | ICD-10-CM

## 2021-03-28 DIAGNOSIS — I2699 Other pulmonary embolism without acute cor pulmonale: Secondary | ICD-10-CM

## 2021-03-28 DIAGNOSIS — E8809 Other disorders of plasma-protein metabolism, not elsewhere classified: Secondary | ICD-10-CM

## 2021-03-28 DIAGNOSIS — R17 Unspecified jaundice: Secondary | ICD-10-CM

## 2021-03-28 HISTORY — DX: Other disorders of plasma-protein metabolism, not elsewhere classified: E88.09

## 2021-03-28 HISTORY — DX: Other pulmonary embolism without acute cor pulmonale: I26.99

## 2021-03-28 HISTORY — DX: Unspecified jaundice: R17

## 2021-03-28 HISTORY — DX: Hypo-osmolality and hyponatremia: E87.1

## 2021-03-28 HISTORY — DX: Diarrhea, unspecified: R19.7

## 2021-03-29 DIAGNOSIS — I340 Nonrheumatic mitral (valve) insufficiency: Secondary | ICD-10-CM

## 2021-03-29 DIAGNOSIS — K838 Other specified diseases of biliary tract: Secondary | ICD-10-CM

## 2021-03-29 DIAGNOSIS — N6320 Unspecified lump in the left breast, unspecified quadrant: Secondary | ICD-10-CM | POA: Insufficient documentation

## 2021-03-29 HISTORY — DX: Nonrheumatic mitral (valve) insufficiency: I34.0

## 2021-03-29 HISTORY — DX: Unspecified lump in the left breast, unspecified quadrant: N63.20

## 2021-03-29 HISTORY — DX: Other specified diseases of biliary tract: K83.8

## 2021-04-20 DIAGNOSIS — R627 Adult failure to thrive: Secondary | ICD-10-CM

## 2021-04-20 DIAGNOSIS — E43 Unspecified severe protein-calorie malnutrition: Secondary | ICD-10-CM | POA: Insufficient documentation

## 2021-04-20 HISTORY — DX: Adult failure to thrive: R62.7

## 2021-04-20 HISTORY — DX: Unspecified severe protein-calorie malnutrition: E43

## 2021-04-22 DIAGNOSIS — K3184 Gastroparesis: Secondary | ICD-10-CM

## 2021-04-22 HISTORY — DX: Gastroparesis: K31.84

## 2021-05-12 DEATH — deceased

## 2021-06-11 ENCOUNTER — Ambulatory Visit: Payer: Medicare HMO | Admitting: Cardiology

## 2022-03-01 IMAGING — US US ABDOMEN COMPLETE
1 of 2 series · 12 of 25 positions shown · non-contrast
Comparison: None.
COMPARISON: None.

Addendum:
CLINICAL DATA: Abdominal distension.

EXAM:
ABDOMEN ULTRASOUND COMPLETE

[Series 1: us abdomen complete · 12 of 84 slices shown]
[im 4/84]
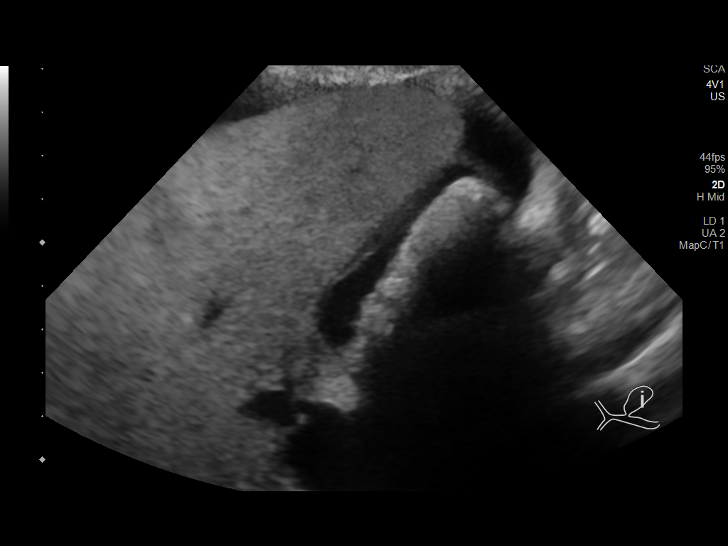
[im 11/84]
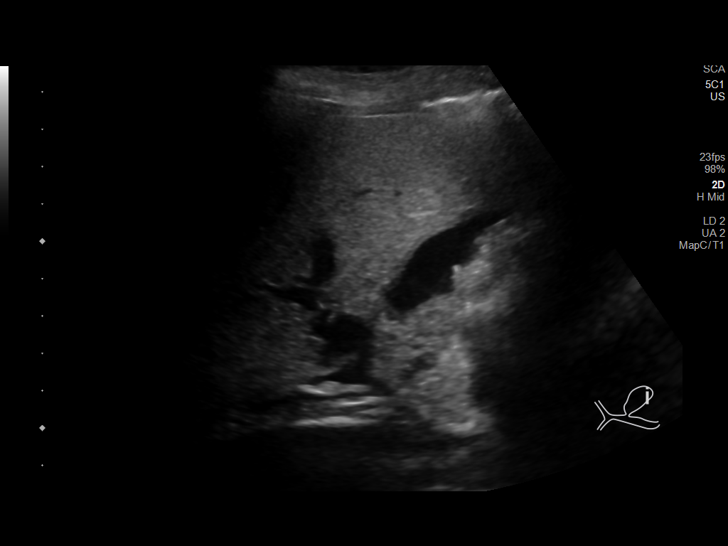
[im 19/84]
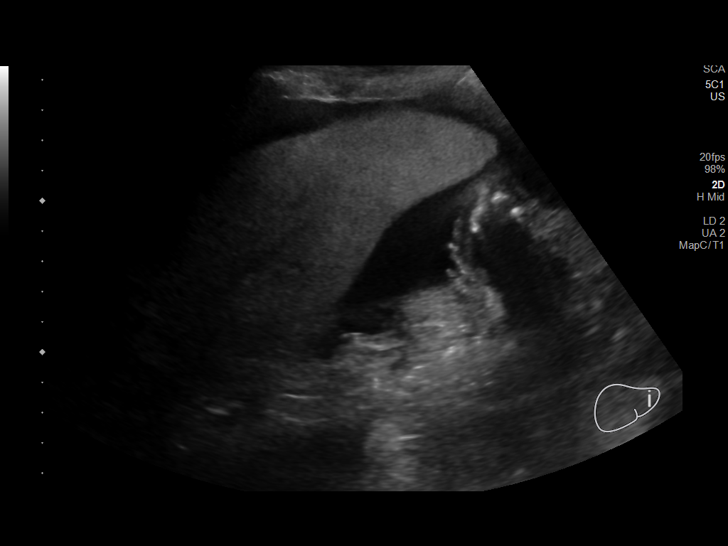
[im 26/84]
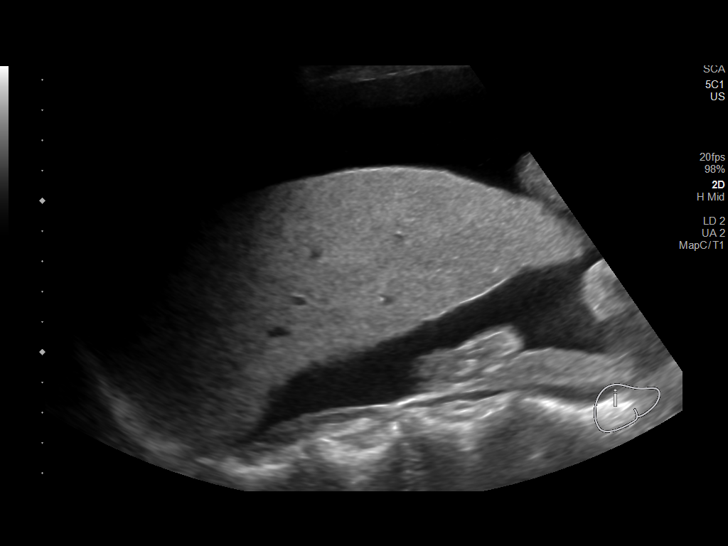
[im 33/84]
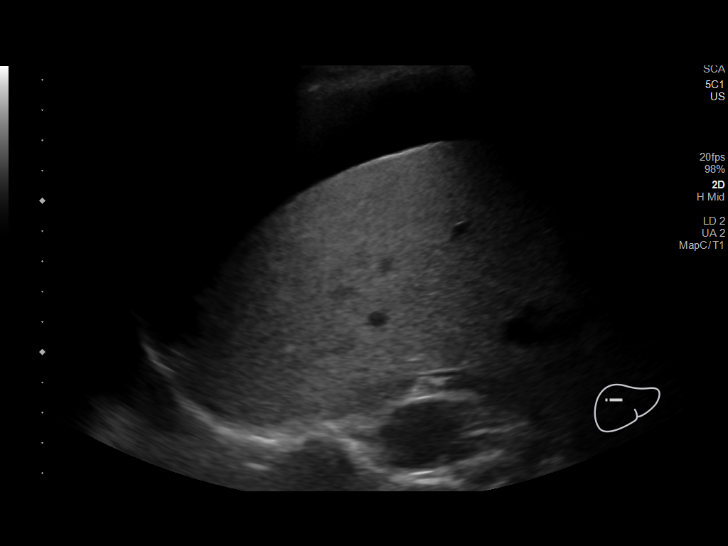
[im 40/84]
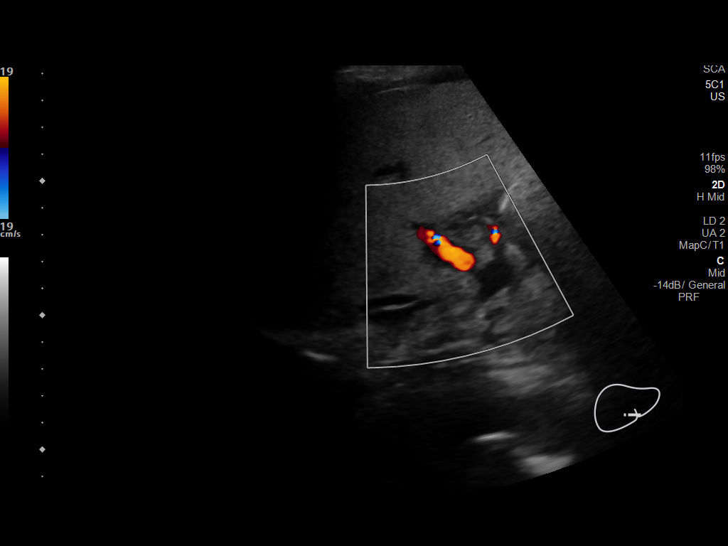
[im 47/84]
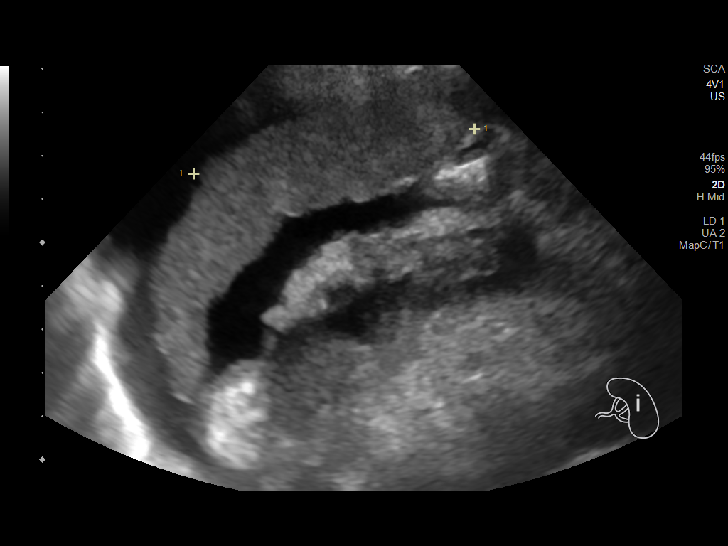
[im 55/84]
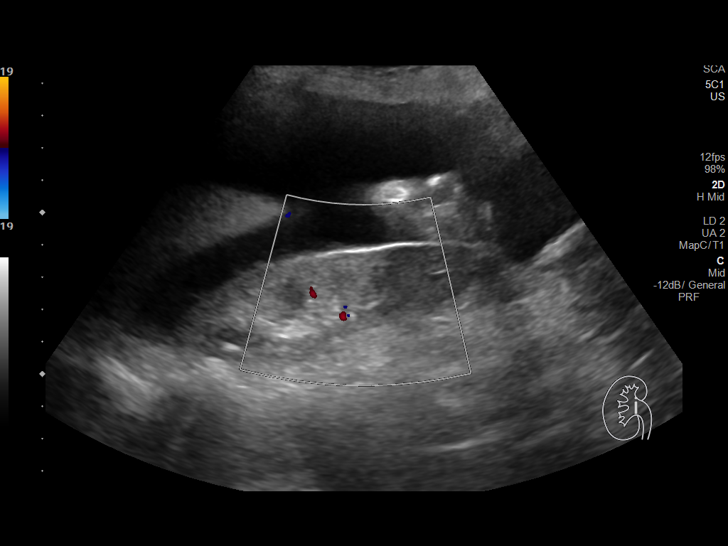
[im 62/84]
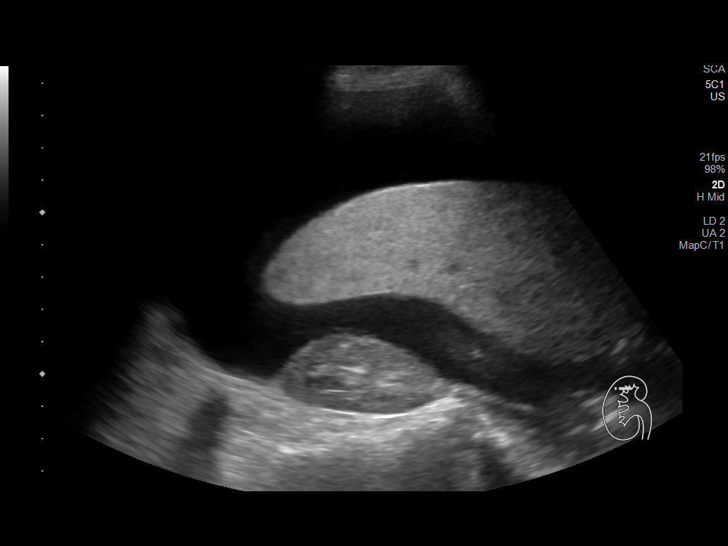
[im 69/84]
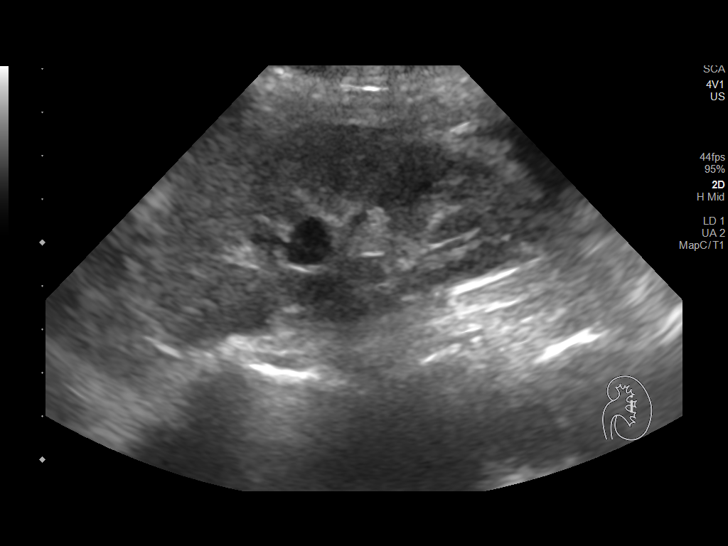
[im 76/84]
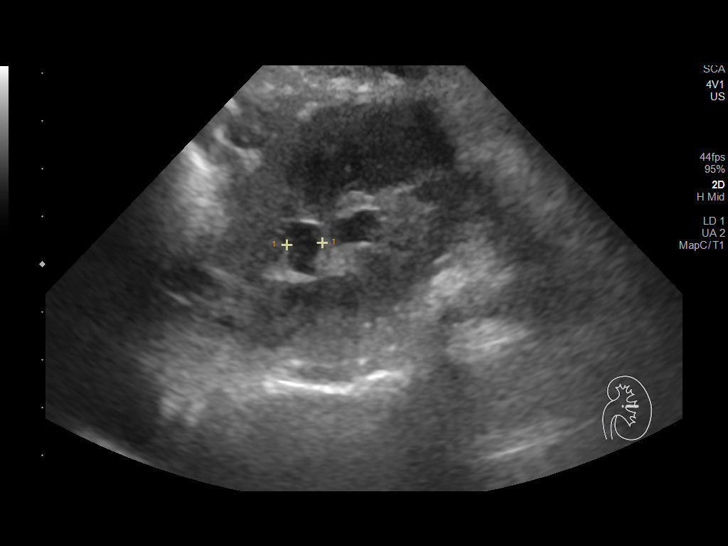
[im 84/84]
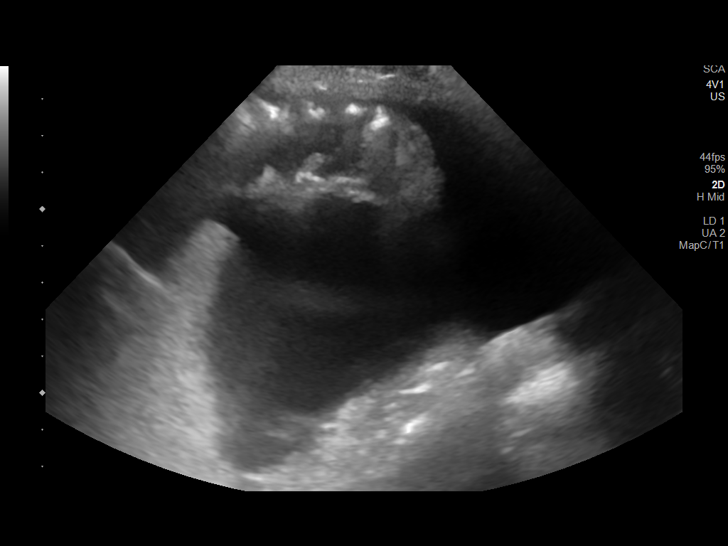

[12 of 25 positions shown; findings below may reference images not displayed]

FINDINGS: Gallbladder: Cholelithiasis measuring 9 mm. There is mild wall
thickening of a predominantly decompressed gallbladder measuring 4
mm, favored sequela of hepatic dysfunction and incomplete
distension. No sonographic Murphy sign noted by sonographer.

Common bile duct: Diameter: 4 mm

Liver: No solid focal lesion identified. Shrunken nodular liver with
coarsened echogenicity. 0.9 cm cyst in the right lobe of the liver.
Portal vein is patent on color Doppler imaging with normal direction
of blood flow towards the liver.

IVC: Compressed by abdominal ascites.

Pancreas: Visualized portion unremarkable.

Spleen: Size and appearance within normal limits.

Right Kidney: Length: 10.4 cm. Echogenicity within normal limits.
1.4 cm renal cyst. Hydronephrosis visualized.

Left Kidney: Length: 9.9 cm. Echogenicity within normal limits.
Multiple renal cysts measuring up to 1.1 cm. Hydronephrosis
visualized.

Abdominal aorta: No aneurysm visualized.

Other findings: Large volume ascites.
IMPRESSION: 1. Large volume ascites.
2. Shrunken, nodular liver with coarsened echogenicity, consistent
with cirrhosis. No solid focal hepatic lesion is identified.
3. Cholelithiasis. Mild gallbladder wall thickening is favored to be
sequela of hepatic dysfunction and incomplete distension.

ADDENDUM:
In the body of the report regarding both the right and left kidneys,
the field should state NO hydronephrosis visualized.

*** End of Addendum ***
FINDINGS: Gallbladder: Cholelithiasis measuring 9 mm. There is mild wall
thickening of a predominantly decompressed gallbladder measuring 4
mm, favored sequela of hepatic dysfunction and incomplete
distension. No sonographic Murphy sign noted by sonographer.

Common bile duct: Diameter: 4 mm

Liver: No solid focal lesion identified. Shrunken nodular liver with
coarsened echogenicity. 0.9 cm cyst in the right lobe of the liver.
Portal vein is patent on color Doppler imaging with normal direction
of blood flow towards the liver.

IVC: Compressed by abdominal ascites.

Pancreas: Visualized portion unremarkable.

Spleen: Size and appearance within normal limits.

Right Kidney: Length: 10.4 cm. Echogenicity within normal limits.
1.4 cm renal cyst. Hydronephrosis visualized.

Left Kidney: Length: 9.9 cm. Echogenicity within normal limits.
Multiple renal cysts measuring up to 1.1 cm. Hydronephrosis
visualized.

Abdominal aorta: No aneurysm visualized.

Other findings: Large volume ascites.
IMPRESSION: 1. Large volume ascites.
2. Shrunken, nodular liver with coarsened echogenicity, consistent
with cirrhosis. No solid focal hepatic lesion is identified.
3. Cholelithiasis. Mild gallbladder wall thickening is favored to be
sequela of hepatic dysfunction and incomplete distension.

## 2022-03-16 IMAGING — US IR PARACENTESIS
1 series · 2 of 2 positions shown · non-contrast
Comparison: none

INDICATION: Abdominal distention. Ascites. Request for diagnostic and
therapeutic paracentesis.

[Series 1: ir (id) (id)/(id)/(id) ir · 2 of 2 slices shown]
[im 1/2]
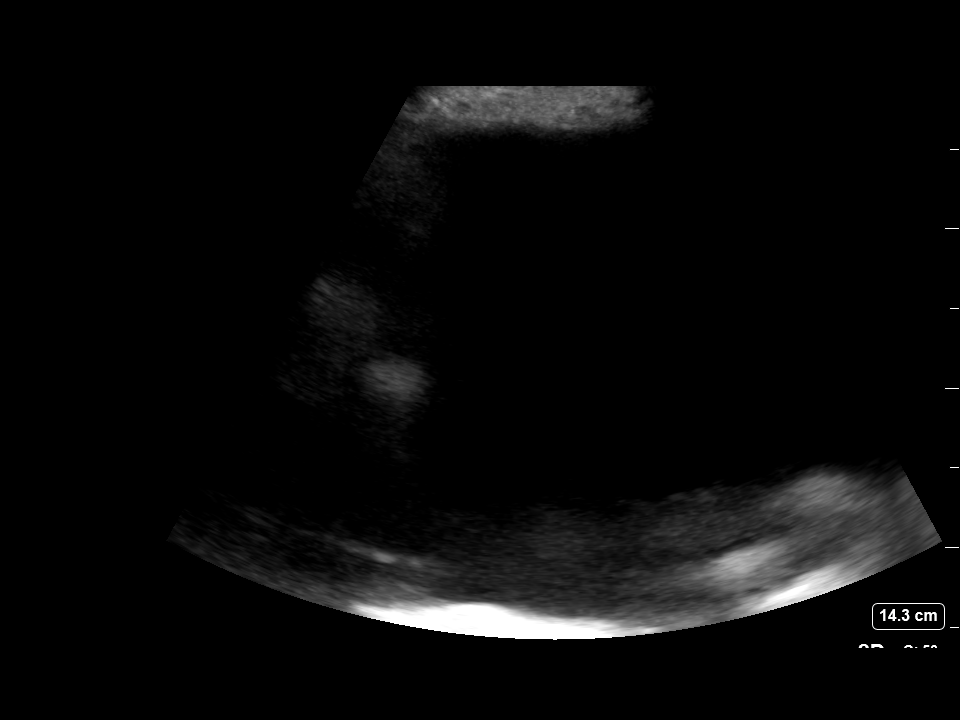
[im 2/2]
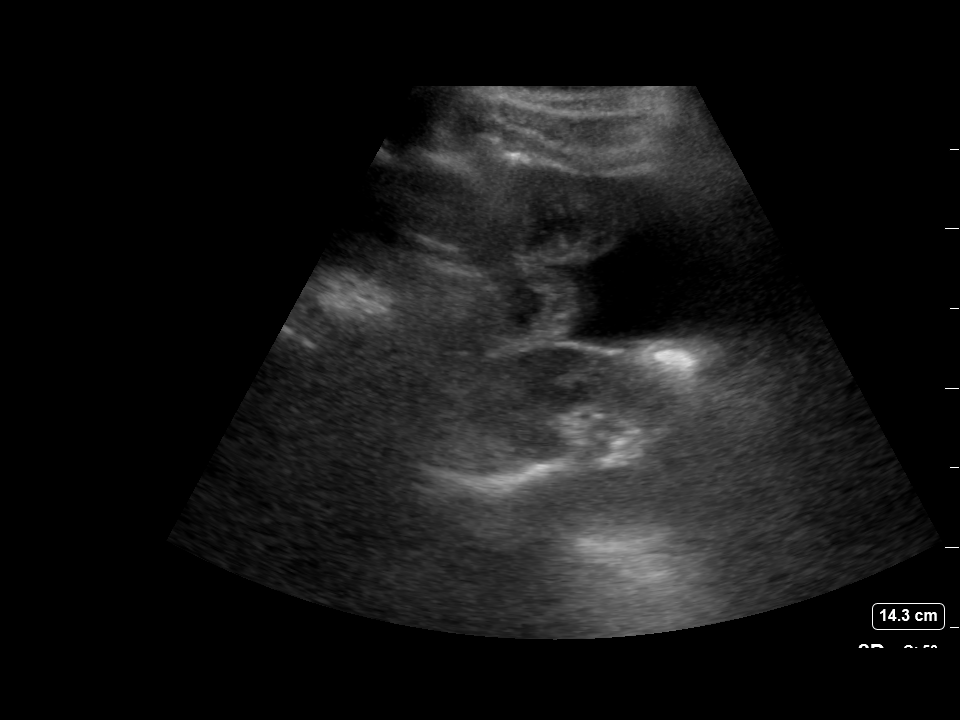

[2 of 2 positions shown; findings below may reference images not displayed]

EXAM:
ULTRASOUND GUIDED LEFT LOWER QUADRANT PARACENTESIS

MEDICATIONS:
1% plain lidocaine, 5 mL

COMPLICATIONS:
None immediate.

PROCEDURE:
Informed written consent was obtained from the patient after a
discussion of the risks, benefits and alternatives to treatment. A
timeout was performed prior to the initiation of the procedure.

Initial ultrasound scanning demonstrates a large amount of ascites
within the left lower abdominal quadrant. The left lower abdomen was
prepped and draped in the usual sterile fashion. 1% lidocaine was
used for local anesthesia.

Following this, a 19 gauge, 7-cm, Yueh catheter was introduced. An
ultrasound image was saved for documentation purposes. The
paracentesis was performed. The catheter was removed and a dressing
was applied. The patient tolerated the procedure well without
immediate post procedural complication.
FINDINGS: A total of approximately 7.6 L of clear yellow fluid was removed.
Samples were sent to the laboratory as requested by the clinical
team.
IMPRESSION: Successful ultrasound-guided paracentesis yielding 7.6 liters of
peritoneal fluid.

## 2022-04-10 IMAGING — CT CT ABD-PELV W/ CM
2 of 5 series · 15 of 46 positions shown, 17 images · IV contrast (APPLIED)
Comparison: Ultrasound abdomen 02/14/2021, CT abdomen pelvis
10/18/2014

CLINICAL DATA: Abdominal distension. Ascites. Hepatic cirrhosis and
large volume ascites on 02/14/2021. Status post removal of 7.5 mL of
fluid. Presenting with fatigue and abdominal distension.

EXAM:
CT ABDOMEN AND PELVIS WITH CONTRAST
TECHNIQUE: Multidetector CT imaging of the abdomen and pelvis was performed
using the standard protocol following bolus administration of
intravenous contrast.
CONTRAST:  100mL OMNIPAQUE IOHEXOL 300 MG/ML  SOLN

[Series 2: axial st · axial · 0.68mm/px · z∈[-529,-154]mm · 12 of 85 slices shown, 14 images]
[im 5/85  soft-tissue]
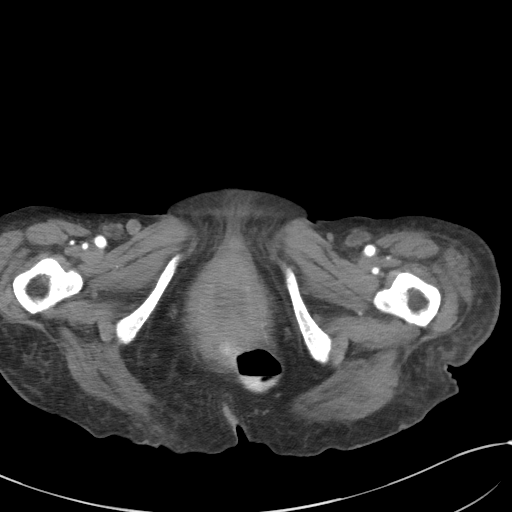
[im 5/85  bone]
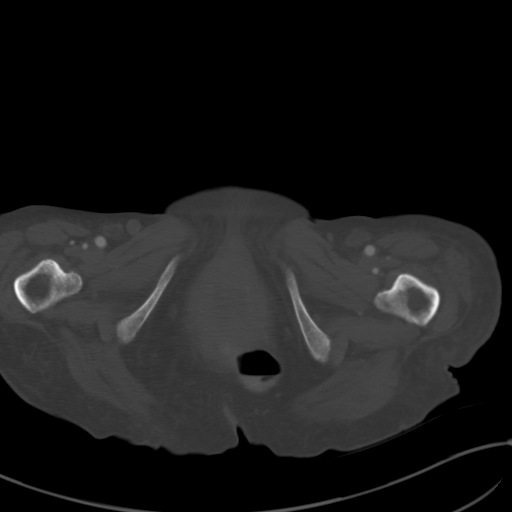
[im 14/85  soft-tissue]
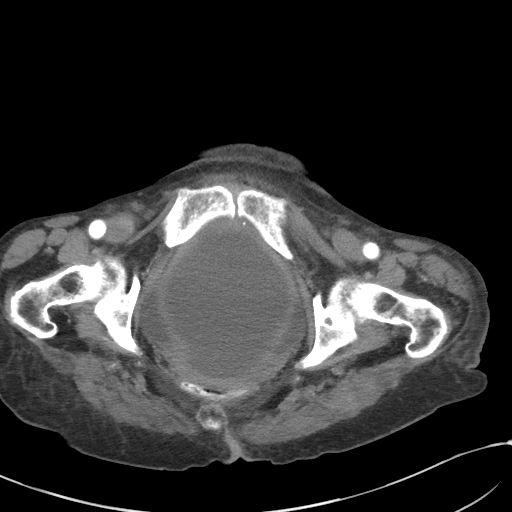
[im 18/85  soft-tissue]
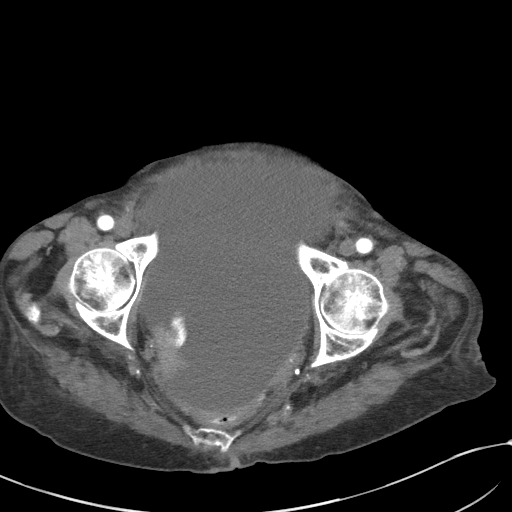
[im 27/85  soft-tissue]
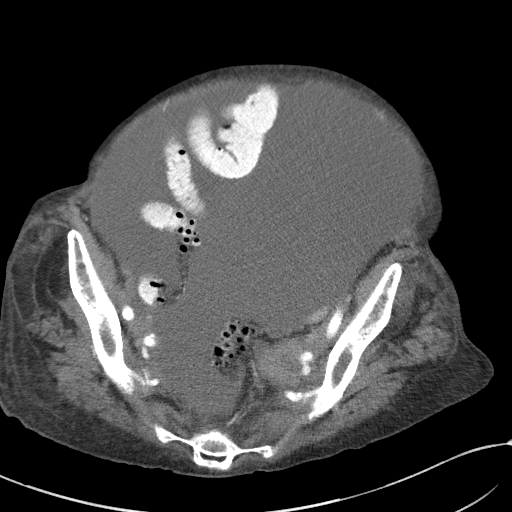
[im 31/85  soft-tissue]
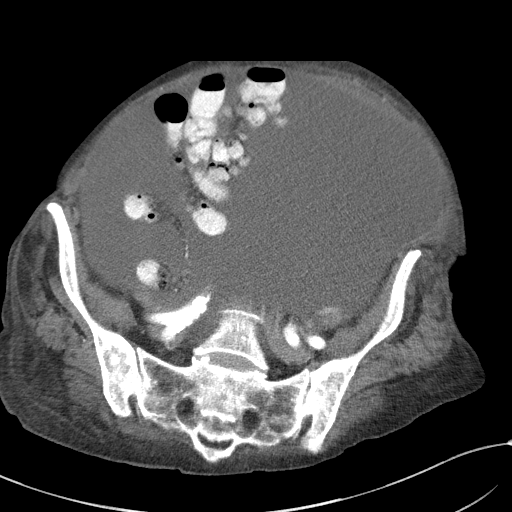
[im 40/85  soft-tissue]
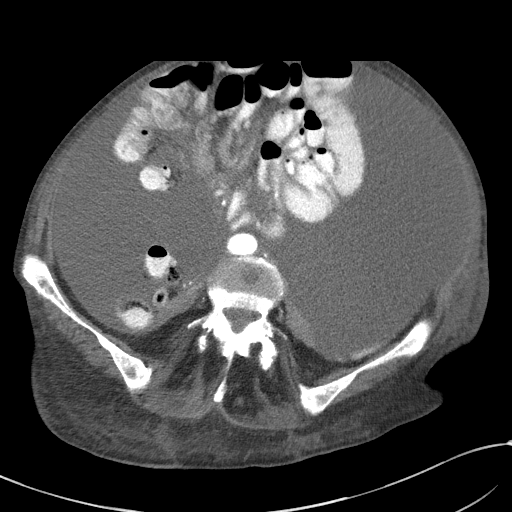
[im 45/85  soft-tissue]
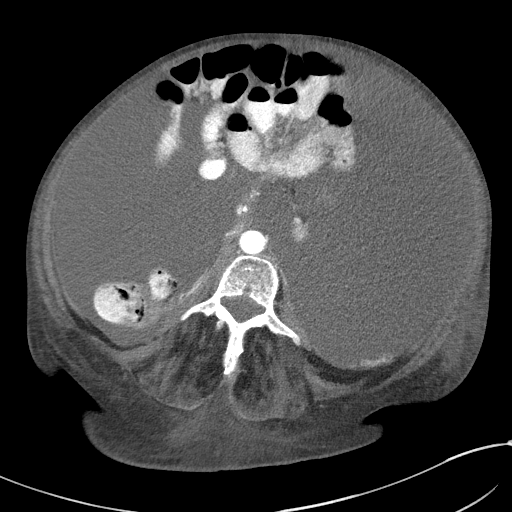
[im 54/85  soft-tissue]
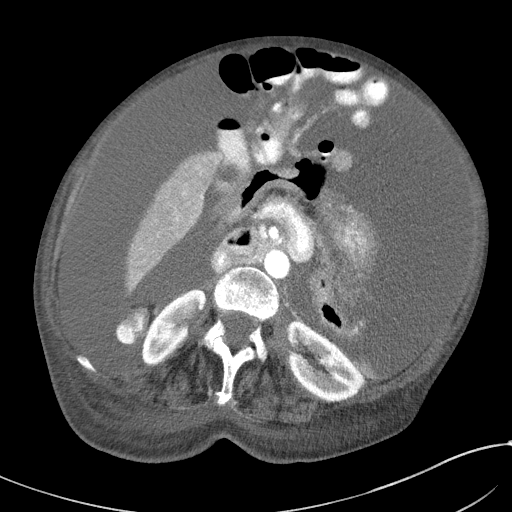
[im 58/85  soft-tissue]
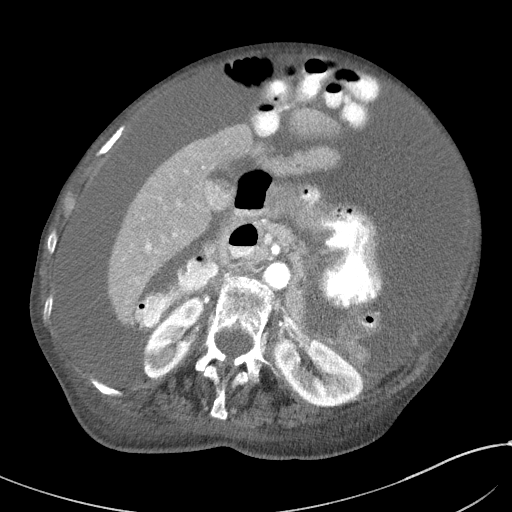
[im 58/85  bone]
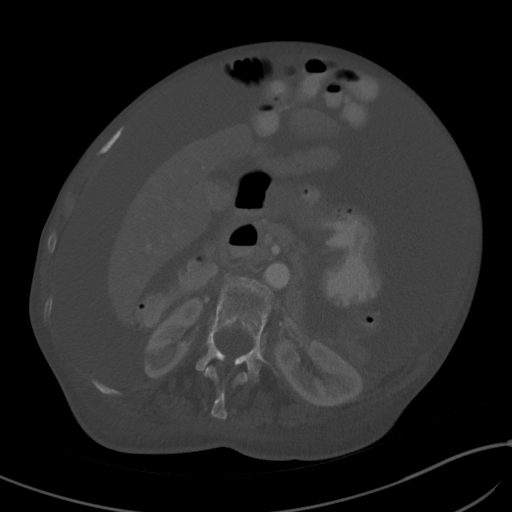
[im 67/85  soft-tissue]
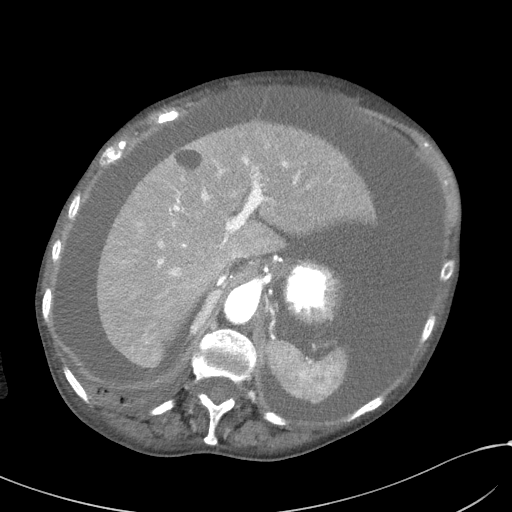
[im 71/85  soft-tissue]
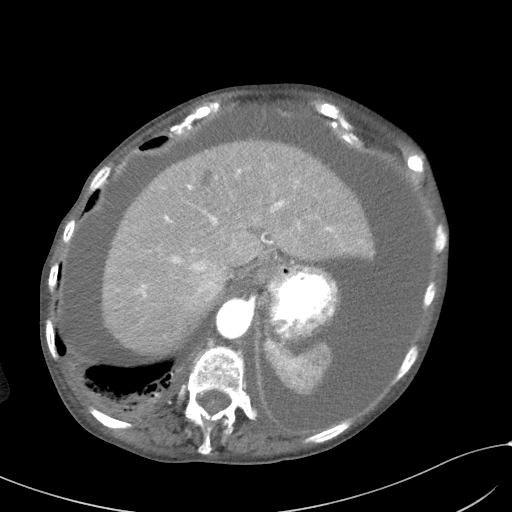
[im 80/85  soft-tissue]
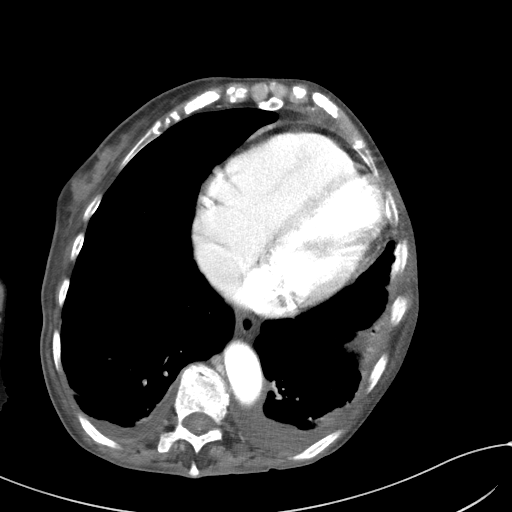

[Series 5: coronal st · coronal · 0.66mm/px · 3 of 105 slices shown]
[im 35/105  soft-tissue]
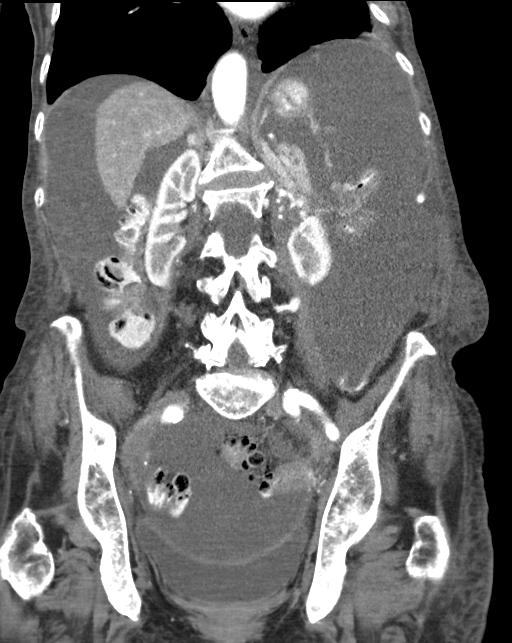
[im 47/105  soft-tissue]
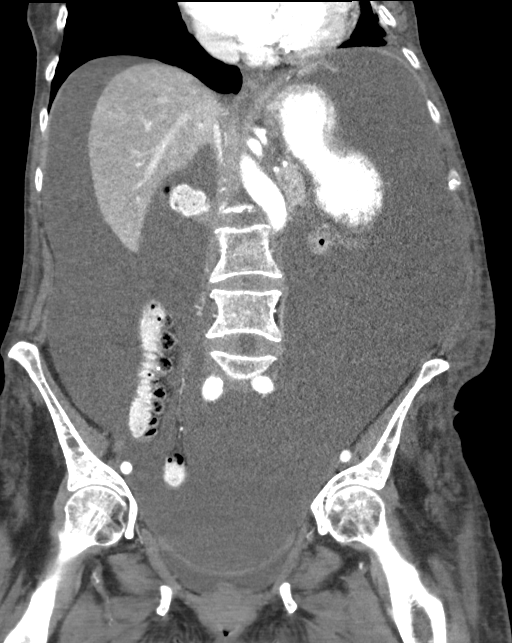
[im 58/105  soft-tissue]
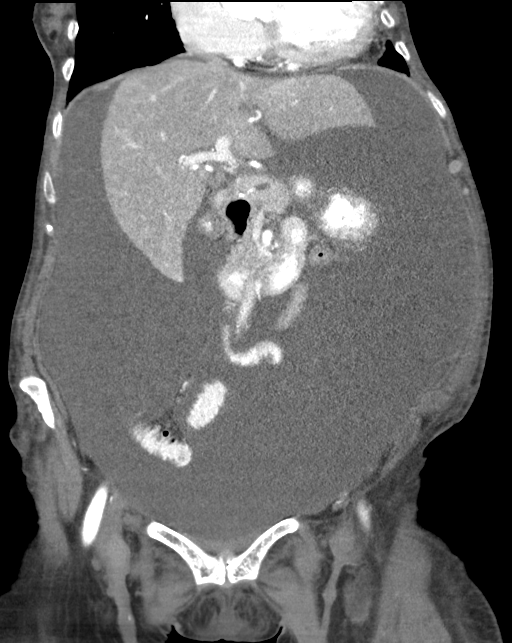

[15 of 46 positions shown; findings below may reference images not displayed]

FINDINGS: Lower chest: Interval development of bilateral trace pleural
effusions, larger on the right. Passive atelectasis bilateral lower
lobes. Enlarged heart. Coronary artery calcifications.

Hepatobiliary: Question mild nodular hepatic contour. Grossly
representing hepatic cysts. Few scattered subcentimeter
hypodensities. Gallstones noted within the gallbladder lumen. No
gallbladder wall thickening or pericholecystic fluid. No biliary
dilatation.

Pancreas: Diffusely atrophic. No focal lesion. Otherwise normal
pancreatic contour. No surrounding inflammatory changes. No main
pancreatic ductal dilatation.

Spleen: Normal in size without focal abnormality.

Adrenals/Urinary Tract:

No adrenal nodule bilaterally.

Bilateral kidneys enhance symmetrically. Pericentimeter fluid
density lesion within the right kidney likely represents a simple
renal cyst.

No hydronephrosis. No hydroureter.

The urinary bladder is unremarkable.

Stomach/Bowel: Stomach is within normal limits. No evidence of bowel
wall thickening or dilatation. Diffuse sigmoid diverticulosis.
Otherwise scattered colonic diverticulosis. Appendix not definitely
identified.

Vascular/Lymphatic: No abdominal aorta or iliac aneurysm. Mild
atherosclerotic plaque of the aorta and its branches. No abdominal,
pelvic, or inguinal lymphadenopathy.

Reproductive: Interval development of prominent bilateral ovaries
(2: 61, 65). Status post hysterectomy.

Other: Large volume simple free fluid ascites. No intraperitoneal
free gas. No organized fluid collection.

Musculoskeletal:

No abdominal wall hernia or abnormality.

No suspicious lytic or blastic osseous lesions. No acute displaced
fracture. Multilevel degenerative changes of the spine.
IMPRESSION: 1. Interval development of bilateral ovarian masses. Status post
hysterectomy. Concern for malignancy. Recommend pelvic ultrasound
further evaluation.
2. Large volume simple ascites.
3. Interval development of bilateral trace pleural effusions.
4. Enlarged heart.
5. Question nodular hepatic contour.
6. Cholelithiasis with no findings of acute cholecystitis.

These results will be called to the ordering clinician or
representative by the Radiologist Assistant, and communication
documented in the PACS or [[id number]].
